# Patient Record
Sex: Male | Born: 1935
Health system: Southern US, Community
[De-identification: ages and names within clinical notes are randomized; demographics above are authoritative.]

## PROBLEM LIST (undated history)

## (undated) DIAGNOSIS — N400 Enlarged prostate without lower urinary tract symptoms: Secondary | ICD-10-CM

## (undated) DIAGNOSIS — Z860100 Personal history of colon polyps, unspecified: Secondary | ICD-10-CM

## (undated) DIAGNOSIS — I10 Essential (primary) hypertension: Secondary | ICD-10-CM

## (undated) DIAGNOSIS — C44320 Squamous cell carcinoma of skin of unspecified parts of face: Secondary | ICD-10-CM

## (undated) DIAGNOSIS — E785 Hyperlipidemia, unspecified: Secondary | ICD-10-CM

## (undated) DIAGNOSIS — H3530 Unspecified macular degeneration: Secondary | ICD-10-CM

## (undated) DIAGNOSIS — N200 Calculus of kidney: Secondary | ICD-10-CM

## (undated) DIAGNOSIS — I729 Aneurysm of unspecified site: Secondary | ICD-10-CM

## (undated) DIAGNOSIS — C439 Malignant melanoma of skin, unspecified: Secondary | ICD-10-CM

## (undated) HISTORY — PX: APPENDECTOMY: SHX54

## (undated) HISTORY — DX: Personal history of colon polyps, unspecified: Z86.0100

## (undated) HISTORY — DX: Unspecified macular degeneration: H35.30

## (undated) HISTORY — DX: Hyperlipidemia, unspecified: E78.5

## (undated) HISTORY — PX: LITHOTRIPSY: SUR834

## (undated) HISTORY — DX: Benign prostatic hyperplasia without lower urinary tract symptoms: N40.0

## (undated) HISTORY — DX: Essential (primary) hypertension: I10

## (undated) HISTORY — PX: OTHER SURGICAL HISTORY: SHX169

## (undated) HISTORY — DX: Personal history of colonic polyps: Z86.010

## (undated) HISTORY — DX: Aneurysm of unspecified site: I72.9

## (undated) HISTORY — DX: Malignant melanoma of skin, unspecified: C43.9

## (undated) HISTORY — DX: Squamous cell carcinoma of skin of unspecified parts of face: C44.320

## (undated) HISTORY — DX: Calculus of kidney: N20.0

---

## 1997-07-10 ENCOUNTER — Other Ambulatory Visit: Admission: RE | Admit: 1997-07-10 | Discharge: 1997-07-10 | Payer: Self-pay | Admitting: Urology

## 2001-11-07 HISTORY — PX: CHOLECYSTECTOMY: SHX55

## 2001-11-14 ENCOUNTER — Encounter: Payer: Self-pay | Admitting: General Surgery

## 2001-11-17 ENCOUNTER — Encounter: Payer: Self-pay | Admitting: General Surgery

## 2001-11-17 ENCOUNTER — Encounter (INDEPENDENT_AMBULATORY_CARE_PROVIDER_SITE_OTHER): Payer: Self-pay | Admitting: *Deleted

## 2001-11-17 ENCOUNTER — Observation Stay (HOSPITAL_COMMUNITY): Admission: RE | Admit: 2001-11-17 | Discharge: 2001-11-18 | Payer: Self-pay | Admitting: General Surgery

## 2003-01-01 ENCOUNTER — Ambulatory Visit (HOSPITAL_COMMUNITY): Admission: RE | Admit: 2003-01-01 | Discharge: 2003-01-01 | Payer: Self-pay | Admitting: Gastroenterology

## 2003-01-01 ENCOUNTER — Encounter (INDEPENDENT_AMBULATORY_CARE_PROVIDER_SITE_OTHER): Payer: Self-pay

## 2004-01-14 ENCOUNTER — Ambulatory Visit: Payer: Self-pay | Admitting: Family Medicine

## 2005-01-22 ENCOUNTER — Ambulatory Visit: Payer: Self-pay | Admitting: Family Medicine

## 2005-02-05 ENCOUNTER — Ambulatory Visit: Payer: Self-pay | Admitting: Family Medicine

## 2005-03-30 ENCOUNTER — Ambulatory Visit: Payer: Self-pay | Admitting: Family Medicine

## 2005-04-17 ENCOUNTER — Ambulatory Visit: Payer: Self-pay | Admitting: Family Medicine

## 2006-03-18 ENCOUNTER — Ambulatory Visit (HOSPITAL_COMMUNITY): Admission: RE | Admit: 2006-03-18 | Discharge: 2006-03-18 | Payer: Self-pay | Admitting: Urology

## 2006-03-19 ENCOUNTER — Ambulatory Visit: Payer: Self-pay | Admitting: Family Medicine

## 2006-03-19 LAB — CONVERTED CEMR LAB
ALT: 32 units/L (ref 0–40)
Alkaline Phosphatase: 81 units/L (ref 39–117)
BUN: 16 mg/dL (ref 6–23)
Basophils Relative: 0.9 % (ref 0.0–1.0)
CO2: 33 meq/L — ABNORMAL HIGH (ref 19–32)
Calcium: 9.4 mg/dL (ref 8.4–10.5)
Chol/HDL Ratio, serum: 3.2
Cholesterol: 127 mg/dL (ref 0–200)
Hemoglobin: 15.3 g/dL (ref 13.0–17.0)
Lymphocytes Relative: 21.4 % (ref 12.0–46.0)
Neutrophils Relative %: 64.9 % (ref 43.0–77.0)
PSA: 0.41 ng/mL (ref 0.10–4.00)
Potassium: 4.4 meq/L (ref 3.5–5.1)
TSH: 0.76 microintl units/mL (ref 0.35–5.50)
Total Protein: 7.2 g/dL (ref 6.0–8.3)
WBC: 9 10*3/uL (ref 4.5–10.5)

## 2006-03-25 ENCOUNTER — Ambulatory Visit: Payer: Self-pay | Admitting: Family Medicine

## 2006-11-19 DIAGNOSIS — H9190 Unspecified hearing loss, unspecified ear: Secondary | ICD-10-CM | POA: Insufficient documentation

## 2006-11-19 DIAGNOSIS — I10 Essential (primary) hypertension: Secondary | ICD-10-CM

## 2006-11-19 DIAGNOSIS — E785 Hyperlipidemia, unspecified: Secondary | ICD-10-CM

## 2007-04-13 ENCOUNTER — Ambulatory Visit: Payer: Self-pay | Admitting: Family Medicine

## 2007-04-13 DIAGNOSIS — N529 Male erectile dysfunction, unspecified: Secondary | ICD-10-CM | POA: Insufficient documentation

## 2008-04-17 ENCOUNTER — Ambulatory Visit: Payer: Self-pay | Admitting: Family Medicine

## 2008-04-17 DIAGNOSIS — Z860100 Personal history of colon polyps, unspecified: Secondary | ICD-10-CM | POA: Insufficient documentation

## 2008-05-01 ENCOUNTER — Ambulatory Visit: Payer: Self-pay | Admitting: Family Medicine

## 2009-04-19 ENCOUNTER — Ambulatory Visit: Payer: Self-pay | Admitting: Family Medicine

## 2009-04-19 DIAGNOSIS — L290 Pruritus ani: Secondary | ICD-10-CM | POA: Insufficient documentation

## 2009-04-19 DIAGNOSIS — R05 Cough: Secondary | ICD-10-CM

## 2009-04-29 ENCOUNTER — Telehealth: Payer: Self-pay | Admitting: Family Medicine

## 2009-05-07 ENCOUNTER — Ambulatory Visit: Payer: Self-pay | Admitting: Family Medicine

## 2009-05-09 ENCOUNTER — Telehealth: Payer: Self-pay | Admitting: Family Medicine

## 2009-05-09 DIAGNOSIS — C801 Malignant (primary) neoplasm, unspecified: Secondary | ICD-10-CM

## 2009-05-09 DIAGNOSIS — L570 Actinic keratosis: Secondary | ICD-10-CM

## 2009-09-16 ENCOUNTER — Ambulatory Visit: Payer: Self-pay | Admitting: Family Medicine

## 2009-09-16 DIAGNOSIS — R55 Syncope and collapse: Secondary | ICD-10-CM | POA: Insufficient documentation

## 2009-09-23 ENCOUNTER — Ambulatory Visit: Payer: Self-pay | Admitting: Family Medicine

## 2009-10-10 ENCOUNTER — Ambulatory Visit: Payer: Self-pay | Admitting: Family Medicine

## 2010-04-06 LAB — CONVERTED CEMR LAB
ALT: 26 units/L (ref 0–53)
ALT: 27 units/L (ref 0–53)
Albumin: 3.8 g/dL (ref 3.5–5.2)
Alkaline Phosphatase: 82 units/L (ref 39–117)
Basophils Relative: 0.2 % (ref 0.0–3.0)
Bilirubin Urine: NEGATIVE
Bilirubin, Direct: 0.1 mg/dL (ref 0.0–0.3)
Bilirubin, Direct: 0.2 mg/dL (ref 0.0–0.3)
Blood in Urine, dipstick: NEGATIVE
CO2: 31 meq/L (ref 19–32)
Calcium: 9.2 mg/dL (ref 8.4–10.5)
Calcium: 9.4 mg/dL (ref 8.4–10.5)
Chloride: 105 meq/L (ref 96–112)
Eosinophils Absolute: 0.4 10*3/uL (ref 0.0–0.7)
Eosinophils Absolute: 0.5 10*3/uL (ref 0.0–0.6)
Eosinophils Relative: 5.8 % — ABNORMAL HIGH (ref 0.0–5.0)
Eosinophils Relative: 6.8 % — ABNORMAL HIGH (ref 0.0–5.0)
GFR calc Af Amer: 95 mL/min
GFR calc non Af Amer: 79 mL/min
Glucose, Bld: 99 mg/dL (ref 70–99)
Glucose, Urine, Semiquant: NEGATIVE
Glucose, Urine, Semiquant: NEGATIVE
HDL: 38.3 mg/dL — ABNORMAL LOW (ref >39.0)
Hemoglobin: 14.2 g/dL (ref 13.0–17.0)
Hemoglobin: 15.1 g/dL (ref 13.0–17.0)
Ketones, urine, test strip: NEGATIVE
LDL Cholesterol: 106 mg/dL — ABNORMAL HIGH (ref 0–99)
Lymphocytes Relative: 18.8 % (ref 12.0–46.0)
Lymphocytes Relative: 21.7 % (ref 12.0–46.0)
MCHC: 33.1 g/dL (ref 30.0–36.0)
MCV: 82.8 fL (ref 78.0–100.0)
MCV: 83.8 fL (ref 78.0–100.0)
Monocytes Absolute: 0.5 10*3/uL (ref 0.2–0.7)
Neutro Abs: 4.5 10*3/uL (ref 1.4–7.7)
Neutro Abs: 4.9 10*3/uL (ref 1.4–7.7)
Neutrophils Relative %: 65.8 % (ref 43.0–77.0)
PSA: 0.4 ng/mL (ref 0.10–4.00)
PSA: 0.45 ng/mL (ref 0.10–4.00)
Platelets: 199 10*3/uL (ref 150–400)
Potassium: 4.7 meq/L (ref 3.5–5.1)
RBC: 5.42 M/uL (ref 4.22–5.81)
Sodium: 141 meq/L (ref 135–145)
Sodium: 144 meq/L (ref 135–145)
TSH: 1.31 microintl units/mL (ref 0.35–5.50)
Total CHOL/HDL Ratio: 4
Total Protein: 7.6 g/dL (ref 6.0–8.3)
Triglycerides: 105 mg/dL (ref 0–149)
WBC Urine, dipstick: NEGATIVE
WBC Urine, dipstick: NEGATIVE
WBC: 7 10*3/uL (ref 4.5–10.5)
WBC: 7.4 10*3/uL (ref 4.5–10.5)
pH: 6.5

## 2010-04-10 NOTE — Assessment & Plan Note (Signed)
Summary: lesion removal on face/njr   Vitals Entered By: Gladis Riffle, RN (May 07, 2009 4:10 PM) CC: lesion removal left cheek and bridge of nose Is Patient Diabetic? No   CC:  lesion removal left cheek and bridge of nose.  History of Present Illness: Derek Baker is a 75 year old male, who comes in today for removal of 3 facial lesions.  Number one 8-mm x  8-mm was between his eyebrows.  Number two... 8 mm times 8 mm in front of left ear.  Number 3.........Marland Kitchen 8 mm x  8-mm.  This left eye    all 3 lesions were anesthetized with 1% xylo  with epi......... shave excisions were done.  The lesions were sent for pathologic analysis.  X 3.  The areas were cauterized.  Band-Aids were applied.  Instructions given for wound care follow-up  as needed   Preventive Screening-Counseling & Management  Alcohol-Tobacco     Smoking Status: quit  Medications Prior to Update: 1)  Simvastatin 20 Mg  Tabs (Simvastatin) .... Take 1 Tablet By Mouth At Bedtime 2)  Hydrochlorothiazide 25 Mg  Tabs (Hydrochlorothiazide) .... Take 1 Tablet By Mouth Once A Day 3)  Amlodipine Besylate 10 Mg  Tabs (Amlodipine Besylate) .... Take 1 Tablet By Mouth Once A Day 4)  Aspirin 81 Mg  Tbec (Aspirin) .... Once Daily 5)  Lisinopril 40 Mg  Tabs (Lisinopril) .... Once Daily 6)  Multivitamins   Tabs (Multiple Vitamin) .... Once Daily 7)  Fish Oil   Oil (Fish Oil) .... Once Daily 8)  Cialis 20 Mg  Tabs (Tadalafil) .... Uad 9)  Anusol-Hc 2.5 % Crea (Hydrocortisone) .... Apply At Bedtime  Allergies (verified): No Known Drug Allergies   Complete Medication List: 1)  Simvastatin 20 Mg Tabs (Simvastatin) .... Take 1 tablet by mouth at bedtime 2)  Hydrochlorothiazide 25 Mg Tabs (Hydrochlorothiazide) .... Take 1 tablet by mouth once a day 3)  Amlodipine Besylate 10 Mg Tabs (Amlodipine besylate) .... Take 1 tablet by mouth once a day 4)  Aspirin 81 Mg Tbec (Aspirin) .... Once daily 5)  Lisinopril 40 Mg Tabs (Lisinopril) .... Once  daily 6)  Multivitamins Tabs (Multiple vitamin) .... Once daily 7)  Fish Oil Oil (Fish oil) .... Once daily 8)  Cialis 20 Mg Tabs (Tadalafil) .... Uad 9)  Anusol-hc 2.5 % Crea (Hydrocortisone) .... Apply at bedtime  Other Orders: Excise Malig lesion ([facility]) 0.6 - 1.0 cm (04540) Excise lesion ([facility]) 0.6-1.0 cm  (11421)

## 2010-04-10 NOTE — Progress Notes (Signed)
  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  He understands and will call [name] to more Initial call taken by: Roderick Pee MD,  May 09, 2009 5:50 PM     Appended Document: Orders Update    Clinical Lists Changes  Orders: Added new Referral order of Dermatology Referral ([facility]) - Signed

## 2010-04-10 NOTE — Assessment & Plan Note (Signed)
Summary: EVAL / NEAR SYNCOPAL EPISODE // RS   Vital Signs:  Patient profile:   75 year old male Weight:      237 pounds Temp:     98.0 degrees F oral BP sitting:   170 / 80  (left arm) Cuff size:   regular  Vitals Entered By: Kern Reap CMA Duncan Dull) (September 16, 2009 3:02 PM) CC: syncopal episode   CC:  syncopal episode.  History of Present Illness: Derek Baker is a 75 year old male, married, nonsmoker, who comes in today accompanied by his wife for evaluation of a near syncopal episode.  Last Saturday night.  He and his wife had just finished dinner.  He was cleaning the table, and all of a sudden, he felt lightheaded.  He felt clammy felt like he was on a pass out, but didn't.  He said that it rested for a few minutes and felt fine.  He had no nausea, vomiting, chest pain, shortness of breath, etc..  BP today here 170/80.  He says his blood pressures at home average 127/62.  His never had a near syncopal or syncopal episode like this in the past.  Allergies: No Known Drug Allergies  Past History:  Past medical, surgical, family and social histories (including risk factors) reviewed, and no changes noted (except as noted below).  Past Medical History: Reviewed history from 04/17/2008 and no changes required. Hyperlipidemia Hypertension hearing loss R ear ED Colonic polyps, hx of  Past Surgical History: Reviewed history from 11/19/2006 and no changes required. Cholecystectomy  09/03  Family History: Reviewed history from 04/13/2007 and no changes required. father died at 9, with a heart attack, question colon.  Question prostate cancer mother is in her 60s has hyperlipidemia 3 sisters in good health  Social History: Reviewed history from 04/13/2007 and no changes required. Retired Married Former Smoker Alcohol use-yes Drug use-no Regular exercise-yes  Review of Systems      See HPI  Physical Exam  General:  Well-developed,well-nourished,in no acute distress;  alert,appropriate and cooperative throughout examination Head:  Normocephalic and atraumatic without obvious abnormalities. No apparent alopecia or balding. Eyes:  No corneal or conjunctival inflammation noted. EOMI. Perrla. Funduscopic exam benign, without hemorrhages, exudates or papilledema. Vision grossly normal. Ears:  External ear exam shows no significant lesions or deformities.  Otoscopic examination reveals clear canals, tympanic membranes are intact bilaterally without bulging, retraction, inflammation or discharge. Hearing is grossly normal bilaterally. Nose:  External nasal examination shows no deformity or inflammation. Nasal mucosa are pink and moist without lesions or exudates. Mouth:  Oral mucosa and oropharynx without lesions or exudates.  Teeth in good repair. Neck:  No deformities, masses, or tenderness noted. Chest Wall:  No deformities, masses, tenderness or gynecomastia noted. Lungs:  Normal respiratory effort, chest expands symmetrically. Lungs are clear to auscultation, no crackles or wheezes. Heart:  Normal rate and regular rhythm. S1 and S2 normal without gallop, murmur, click, rub or other extra sounds. Pulses:  R and L carotid,radial,femoral,dorsalis pedis and posterior tibial pulses are full and equal bilaterallyno carotid bruits   Impression & Recommendations:  Problem # 1:  SYNCOPE AND COLLAPSE (ICD-780.2) Assessment New  Orders: EKG w/ Interpretation (93000)  Complete Medication List: 1)  Simvastatin 20 Mg Tabs (Simvastatin) .... Take 1 tablet by mouth at bedtime 2)  Hydrochlorothiazide 25 Mg Tabs (Hydrochlorothiazide) .... Take 1 tablet by mouth once a day 3)  Amlodipine Besylate 10 Mg Tabs (Amlodipine besylate) .... Take 1 tablet by mouth once a day  4)  Aspirin 81 Mg Tbec (Aspirin) .... Once daily 5)  Lisinopril 40 Mg Tabs (Lisinopril) .... Once daily 6)  Multivitamins Tabs (Multiple vitamin) .... Once daily 7)  Fish Oil Oil (Fish oil) .... Once daily 8)   Cialis 20 Mg Tabs (Tadalafil) .... Uad 9)  Anusol-hc 2.5 % Crea (Hydrocortisone) .... Apply at bedtime  Patient Instructions: 1)  check your blood pressure 3 times a day with a new digital blood pressure cuff and bring a record of all your blood pressure readings and the device back in one week for follow-up. 2)  In the meantime if you have any episodes of lightheadedness immediately lie on the floor and put your feet up in the air

## 2010-04-10 NOTE — Assessment & Plan Note (Signed)
Summary: 1 wk rov/njr   Vital Signs:  Patient profile:   75 year old male Weight:      236 pounds Temp:     97.6 degrees F oral BP sitting:   170 / 90  (right arm) Cuff size:   regular  Vitals Entered By: Kern Reap CMA Duncan Dull) (September 23, 2009 1:48 PM) CC: follow-up visit   CC:  follow-up visit.  History of Present Illness: Derek Baker is a 75 year old male, who comes in today for follow-up of hypertension.  His current medications are hydrochlorothiazide 25 mg daily, Norvasc, 10 mg daily, lisinopril 80 mg daily.  BP 150/70.  He also wonders if his Cialis which he takes 10 mg p.r.n. might be contributing to episodes of low blood pressure.  The digital device.  They bring in his not accurate  Allergies: No Known Drug Allergies  Review of Systems      See HPI  Physical Exam  General:  Well-developed,well-nourished,in no acute distress; alert,appropriate and cooperative throughout examination Heart:  150/70 right arm sitting position   Impression & Recommendations:  Problem # 1:  HYPERTENSION (ICD-401.9) Assessment Improved  His updated medication list for this problem includes:    Hydrochlorothiazide 25 Mg Tabs (Hydrochlorothiazide) .Marland Kitchen... Take 1 tablet by mouth once a day    Amlodipine Besylate 10 Mg Tabs (Amlodipine besylate) .Marland Kitchen... Take 1 tablet by mouth once a day    Lisinopril 40 Mg Tabs (Lisinopril) ..... Once daily  Complete Medication List: 1)  Simvastatin 20 Mg Tabs (Simvastatin) .... Take 1 tablet by mouth at bedtime 2)  Hydrochlorothiazide 25 Mg Tabs (Hydrochlorothiazide) .... Take 1 tablet by mouth once a day 3)  Amlodipine Besylate 10 Mg Tabs (Amlodipine besylate) .... Take 1 tablet by mouth once a day 4)  Aspirin 81 Mg Tbec (Aspirin) .... Once daily 5)  Lisinopril 40 Mg Tabs (Lisinopril) .... Once daily 6)  Multivitamins Tabs (Multiple vitamin) .... Once daily 7)  Fish Oil Oil (Fish oil) .... Once daily 8)  Cialis 20 Mg Tabs (Tadalafil) .... Uad 9)   Anusol-hc 2.5 % Crea (Hydrocortisone) .... Apply at bedtime  Patient Instructions: 1)  continue current medications. 2)  Quarter the Cialis and monitor your blood pressure.  The next day or two after using the Cialis and if it appears this Cialis is causing her blood pressure dropped and hold lisinopril for a day or two 3)  return in 3 weeks for follow-up with your blood pressure readings i......and  the new device

## 2010-04-10 NOTE — Progress Notes (Signed)
Summary: NEW RX  Phone Note Call from Patient Call back at Home Phone [phone]   Caller: Patient Call For: Roderick Pee MD Summary of Call: PT NEEDS NEW RX CIALIS 20 MG CALL INTO [facility] [phone]. Initial call taken by: Heron Sabins,  April 29, 2009 12:37 PM    Prescriptions: CIALIS 20 MG  TABS (TADALAFIL) UAD  #6 x 11   Entered by:   Kern Reap CMA (AAMA)   Authorized by:   Roderick Pee MD   Signed by:   Kern Reap CMA (AAMA) on 04/29/2009   Method used:   Electronically to        [facility]* (retail)       [address]       [place], [place]  [address]       Ph: [phone]       Fax: [phone]   RxID:   [id number]

## 2010-04-10 NOTE — Assessment & Plan Note (Signed)
Summary: 3 wk rov/njr   Vital Signs:  Patient profile:   75 year old male Weight:      234 pounds Temp:     97.6 degrees F oral BP sitting:   140 / 80  (left arm) Cuff size:   regular  Vitals Entered By: Kern Reap CMA Duncan Dull) (October 10, 2009 8:46 AM) CC: follow-up visit   CC:  follow-up visit.  History of Present Illness: Derek Baker is a 75 year old, married male, who comes in today for evaluation of hypertension.  His current antihypertensive treatment for her thiazide 25 mg daily, Norvasc, 10 mg daily, lisinopril 40 mg daily.  BP 140/80 with his cuff and ours........  BPs at home, normal  Allergies: No Known Drug Allergies  Past History:  Past medical, surgical, family and social histories (including risk factors) reviewed for relevance to current acute and chronic problems.  Past Medical History: Reviewed history from 04/17/2008 and no changes required. Hyperlipidemia Hypertension hearing loss R ear ED Colonic polyps, hx of  Past Surgical History: Reviewed history from 11/19/2006 and no changes required. Cholecystectomy  09/03  Family History: Reviewed history from 04/13/2007 and no changes required. father died at 70, with a heart attack, question colon.  Question prostate cancer mother is in her 78s has hyperlipidemia 3 sisters in good health  Social History: Reviewed history from 04/13/2007 and no changes required. Retired Married Former Smoker Alcohol use-yes Drug use-no Regular exercise-yes  Review of Systems      See HPI  Physical Exam  General:  Well-developed,well-nourished,in no acute distress; alert,appropriate and cooperative throughout examination Heart:   140/80   Complete Medication List: 1)  Simvastatin 20 Mg Tabs (Simvastatin) .... Take 1 tablet by mouth at bedtime 2)  Hydrochlorothiazide 25 Mg Tabs (Hydrochlorothiazide) .... Take 1 tablet by mouth once a day 3)  Amlodipine Besylate 10 Mg Tabs (Amlodipine besylate) .... Take 1  tablet by mouth once a day 4)  Aspirin 81 Mg Tbec (Aspirin) .... Once daily 5)  Lisinopril 40 Mg Tabs (Lisinopril) .... Once daily 6)  Multivitamins Tabs (Multiple vitamin) .... Once daily 7)  Fish Oil Oil (Fish oil) .... Once daily 8)  Cialis 20 Mg Tabs (Tadalafil) .... Uad 9)  Anusol-hc 2.5 % Crea (Hydrocortisone) .... Apply at bedtime  Patient Instructions: 1)  continue current medications. 2)  Check a blood pressure weekly. 3)  Return in February for your manual exam, sooner if any problems

## 2010-04-10 NOTE — Assessment & Plan Note (Signed)
Summary: emp/pt fasting/cjr   Vital Signs:  Patient profile:   75 year old male Height:      71 inches Weight:      234 pounds BMI:     32.75 Temp:     99.1 degrees F oral BP sitting:   142 / 90  (left arm) Cuff size:   regular  Vitals Entered By: Kern Reap CMA Duncan Dull) (April 19, 2009 8:31 AM)  Reason for Visit cpx  History of Present Illness: Derek Baker is a 75 year old male, retired, nonsmoker, who comes in today for multiple issues.  He takes simvastatin 20 mg nightly for hyperlipidemia.  Will check lipid panel today.  He takes hydrochlorothiazide 25 mg daily, amlodipine 10 mg daily, lisinopril 40 mg daily BP at home systolic ranges from 120 to 130, diastolic 70 to 80.  He also uses Cialis p.r.n. erectile dysfunction.  He takes an aspirin tablet daily.  His having trouble with rectal itching.  Colonoscopy last year.  Normal  He also has some skin lesions he would like checked.  He had a febrile illness in December with a mild cough.  The cough has persisted.  He, thinks he's wheezing.  He gets routine eye care.  Dental care.  Colonoscopy done in GI 2009 normal, tetanus, 2009, seasonal flu 2010, Pneumovax 2009, shingles 2010.  Allergies: No Known Drug Allergies  Past History:  Past medical, surgical, family and social histories (including risk factors) reviewed, and no changes noted (except as noted below).  Past Medical History: Reviewed history from 04/17/2008 and no changes required. Hyperlipidemia Hypertension hearing loss R ear ED Colonic polyps, hx of  Past Surgical History: Reviewed history from 11/19/2006 and no changes required. Cholecystectomy  09/03  Family History: Reviewed history from 04/13/2007 and no changes required. father died at 49, with a heart attack, question colon.  Question prostate cancer mother is in her 53s has hyperlipidemia 3 sisters in good health  Social History: Reviewed history from 04/13/2007 and no changes  required. Retired Married Former Smoker Alcohol use-yes Drug use-no Regular exercise-yes  Review of Systems      See HPI  Physical Exam  General:  Well-developed,well-nourished,in no acute distress; alert,appropriate and cooperative throughout examination Head:  Normocephalic and atraumatic without obvious abnormalities. No apparent alopecia or balding. Eyes:  No corneal or conjunctival inflammation noted. EOMI. Perrla. Funduscopic exam benign, without hemorrhages, exudates or papilledema. Vision grossly normal. Ears:  External ear exam shows no significant lesions or deformities.  Otoscopic examination reveals clear canals, tympanic membranes are intact bilaterally without bulging, retraction, inflammation or discharge. Hearing is grossly normal bilaterally. Nose:  External nasal examination shows no deformity or inflammation. Nasal mucosa are pink and moist without lesions or exudates. Mouth:  Oral mucosa and oropharynx without lesions or exudates.  Teeth in good repair. Neck:  No deformities, masses, or tenderness noted. Chest Wall:  No deformities, masses, tenderness or gynecomastia noted. Breasts:  No masses or gynecomastia noted Lungs:  Normal respiratory effort, chest expands symmetrically. Lungs are clear to auscultation, no crackles or wheezes. Heart:  Normal rate and regular rhythm. S1 and S2 normal without gallop, murmur, click, rub or other extra sounds. Abdomen:  Bowel sounds positive,abdomen soft and non-tender without masses, organomegaly or hernias noted. Rectal:  No external abnormalities noted. Normal sphincter tone. No rectal masses or tenderness. Genitalia:  Testes bilaterally descended without nodularity, tenderness or masses. No scrotal masses or lesions. No penis lesions or urethral discharge. Prostate:  Prostate gland firm and smooth,  no enlargement, nodularity, tenderness, mass, asymmetry or induration. Msk:  No deformity or scoliosis noted of thoracic or lumbar  spine.   Pulses:  R and L carotid,radial,femoral,dorsalis pedis and posterior tibial pulses are full and equal bilaterally Extremities:  No clubbing, cyanosis, edema, or deformity noted with normal full range of motion of all joints.   Neurologic:  No cranial nerve deficits noted. Station and gait are normal. Plantar reflexes are down-going bilaterally. DTRs are symmetrical throughout. Sensory, motor and coordinative functions appear intact. Skin:  Intact without suspicious lesions or rashes Cervical Nodes:  No lymphadenopathy noted Axillary Nodes:  No palpable lymphadenopathy Inguinal Nodes:  No significant adenopathy Psych:  Cognition and judgment appear intact. Alert and cooperative with normal attention span and concentration. No apparent delusions, illusions, hallucinations   Impression & Recommendations:  Problem # 1:  ERECTILE DYSFUNCTION, ORGANIC (ICD-607.84) Assessment Improved  His updated medication list for this problem includes:    Cialis 20 Mg Tabs (Tadalafil) ..... Uad  Orders: Venipuncture (16109) TLB-Lipid Panel (80061-LIPID) TLB-BMP (Basic Metabolic Panel-BMET) (80048-METABOL) TLB-CBC Platelet - w/Differential (85025-CBCD) TLB-Hepatic/Liver Function Pnl (80076-HEPATIC) TLB-TSH (Thyroid Stimulating Hormone) (84443-TSH) TLB-PSA (Prostate Specific Antigen) (84153-PSA) Prescription Created Electronically [phone]) UA Dipstick w/o Micro (automated)  (81003)  Problem # 2:  HYPERTENSION (ICD-401.9) Assessment: Improved  His updated medication list for this problem includes:    Hydrochlorothiazide 25 Mg Tabs (Hydrochlorothiazide) .Marland Kitchen... Take 1 tablet by mouth once a day    Amlodipine Besylate 10 Mg Tabs (Amlodipine besylate) .Marland Kitchen... Take 1 tablet by mouth once a day    Lisinopril 40 Mg Tabs (Lisinopril) ..... Once daily  Orders: Venipuncture (09811) TLB-Lipid Panel (80061-LIPID) TLB-BMP (Basic Metabolic Panel-BMET) (80048-METABOL) TLB-CBC Platelet - w/Differential  (85025-CBCD) TLB-Hepatic/Liver Function Pnl (80076-HEPATIC) TLB-TSH (Thyroid Stimulating Hormone) (84443-TSH) TLB-PSA (Prostate Specific Antigen) (84153-PSA) Prescription Created Electronically [phone]) UA Dipstick w/o Micro (automated)  (81003) EKG w/ Interpretation (93000)  Problem # 3:  HYPERLIPIDEMIA (ICD-272.4) Assessment: Improved  His updated medication list for this problem includes:    Simvastatin 20 Mg Tabs (Simvastatin) .Marland Kitchen... Take 1 tablet by mouth at bedtime  Orders: Venipuncture (29562) TLB-Lipid Panel (80061-LIPID) TLB-BMP (Basic Metabolic Panel-BMET) (80048-METABOL) TLB-CBC Platelet - w/Differential (85025-CBCD) TLB-Hepatic/Liver Function Pnl (80076-HEPATIC) TLB-TSH (Thyroid Stimulating Hormone) (84443-TSH) TLB-PSA (Prostate Specific Antigen) (84153-PSA) Prescription Created Electronically [phone]) UA Dipstick w/o Micro (automated)  (81003) EKG w/ Interpretation (93000)  Problem # 4:  COUGH (ICD-786.2) Assessment: New  Orders: Venipuncture (57846) TLB-Lipid Panel (80061-LIPID) TLB-BMP (Basic Metabolic Panel-BMET) (80048-METABOL) TLB-CBC Platelet - w/Differential (85025-CBCD) TLB-Hepatic/Liver Function Pnl (80076-HEPATIC) TLB-TSH (Thyroid Stimulating Hormone) (84443-TSH) TLB-PSA (Prostate Specific Antigen) (84153-PSA) Prescription Created Electronically [phone]) UA Dipstick w/o Micro (automated)  (81003) T-2 View CXR (71020TC)  Problem # 5:  ANAL PRURITUS (ICD-698.0) Assessment: New  Orders: Venipuncture (28413) TLB-Lipid Panel (80061-LIPID) TLB-BMP (Basic Metabolic Panel-BMET) (80048-METABOL) TLB-CBC Platelet - w/Differential (85025-CBCD) TLB-Hepatic/Liver Function Pnl (80076-HEPATIC) TLB-TSH (Thyroid Stimulating Hormone) (84443-TSH) TLB-PSA (Prostate Specific Antigen) (84153-PSA) Prescription Created Electronically [phone]) UA Dipstick w/o Micro (automated)  (81003)  Complete Medication List: 1)  Simvastatin 20 Mg Tabs (Simvastatin) .... Take 1  tablet by mouth at bedtime 2)  Hydrochlorothiazide 25 Mg Tabs (Hydrochlorothiazide) .... Take 1 tablet by mouth once a day 3)  Amlodipine Besylate 10 Mg Tabs (Amlodipine besylate) .... Take 1 tablet by mouth once a day 4)  Aspirin 81 Mg Tbec (Aspirin) .... Once daily 5)  Lisinopril 40 Mg Tabs (Lisinopril) .... Once daily 6)  Multivitamins Tabs (Multiple vitamin) .... Once daily 7)  Fish Oil Oil (Fish oil) .Marland KitchenMarland KitchenMarland Kitchen  Once daily 8)  Cialis 20 Mg Tabs (Tadalafil) .... Uad 9)  Anusol-hc 2.5 % Crea (Hydrocortisone) .... Apply at bedtime  Patient Instructions: 1)  return sometime in the next couple weeks to have the two lesions removed from her face. 2)  Stop by the main office on the way home for a chest x-ray....... I will call you the report 3)  use the Anusol-HC cream at bedtime p.r.n. 4)  Please schedule a follow-up appointment in 1 year. Prescriptions: CIALIS 20 MG  TABS (TADALAFIL) UAD  #6 x 11   Entered and Authorized by:   Roderick Pee MD   Signed by:   Roderick Pee MD on 04/19/2009   Method used:   Electronically to        [facility]* (mail-order)             ,          Ph: [phone]       Fax: [phone]   RxID:   [id number] LISINOPRIL 40 MG  TABS (LISINOPRIL) once daily  #100 x 4   Entered and Authorized by:   Roderick Pee MD   Signed by:   Roderick Pee MD on 04/19/2009   Method used:   Electronically to        [facility]* (mail-order)             ,          Ph: [phone]       Fax: [phone]   RxID:   [id number] AMLODIPINE BESYLATE 10 MG  TABS (AMLODIPINE BESYLATE) Take 1 tablet by mouth once a day  #100 x 4   Entered and Authorized by:   Roderick Pee MD   Signed by:   Roderick Pee MD on 04/19/2009   Method used:   Electronically to        [facility]* (mail-order)             ,          Ph: [phone]       Fax: [phone]   RxID:   [id number] HYDROCHLOROTHIAZIDE 25 MG  TABS (HYDROCHLOROTHIAZIDE) Take 1 tablet by mouth  once a day  #100 x 4   Entered and Authorized by:   Roderick Pee MD   Signed by:   Roderick Pee MD on 04/19/2009   Method used:   Electronically to        [facility]* (mail-order)             ,          Ph: [phone]       Fax: [phone]   RxID:   [id number] SIMVASTATIN 20 MG  TABS (SIMVASTATIN) Take 1 tablet by mouth at bedtime  #100 x 4   Entered and Authorized by:   Roderick Pee MD   Signed by:   Roderick Pee MD on 04/19/2009   Method used:   Electronically to        [facility]* (mail-order)             ,          Ph: [phone]       Fax: [phone]   RxID:   [id number] ANUSOL-HC 2.5 % CREA (HYDROCORTISONE) apply at bedtime  #30 gr x 4   Entered and Authorized by:   Roderick Pee MD   Signed by:   Tinnie Gens  Shawnie Dapper MD on 04/19/2009   Method used:   Electronically to        [facility]* [facility])             ,          Ph: [phone]       Fax: [phone]   RxID:   [id number]    Immunization History:  Influenza Immunization History:    Influenza:  historical (12/07/2008)   Appended Document: emp/pt fasting/cjr  Laboratory Results   Urine Tests    Routine Urinalysis   Color: yellow Appearance: Clear Glucose: negative   (Normal Range: Negative) Bilirubin: negative   (Normal Range: Negative) Ketone: negative   (Normal Range: Negative) Spec. Gravity: 1.020   (Normal Range: 1.003-1.035) Blood: negative   (Normal Range: Negative) pH: 5.5   (Normal Range: 5.0-8.0) Protein: negative   (Normal Range: Negative) Urobilinogen: 0.2   (Normal Range: 0-1) Nitrite: negative   (Normal Range: Negative) Leukocyte Esterace: negative   (Normal Range: Negative)    Comments: Rita Ohara  April 19, 2009 5:03 PM

## 2010-04-18 ENCOUNTER — Encounter: Payer: Self-pay | Admitting: Family Medicine

## 2010-04-21 ENCOUNTER — Ambulatory Visit (INDEPENDENT_AMBULATORY_CARE_PROVIDER_SITE_OTHER): Payer: Medicare Other | Admitting: Family Medicine

## 2010-04-21 ENCOUNTER — Encounter: Payer: Self-pay | Admitting: Family Medicine

## 2010-04-21 DIAGNOSIS — E785 Hyperlipidemia, unspecified: Secondary | ICD-10-CM

## 2010-04-21 DIAGNOSIS — N138 Other obstructive and reflux uropathy: Secondary | ICD-10-CM

## 2010-04-21 DIAGNOSIS — H9190 Unspecified hearing loss, unspecified ear: Secondary | ICD-10-CM

## 2010-04-21 DIAGNOSIS — I10 Essential (primary) hypertension: Secondary | ICD-10-CM

## 2010-04-21 DIAGNOSIS — N139 Obstructive and reflux uropathy, unspecified: Secondary | ICD-10-CM

## 2010-04-21 DIAGNOSIS — N401 Enlarged prostate with lower urinary tract symptoms: Secondary | ICD-10-CM

## 2010-04-21 DIAGNOSIS — N529 Male erectile dysfunction, unspecified: Secondary | ICD-10-CM

## 2010-04-21 LAB — CBC WITH DIFFERENTIAL/PLATELET
Basophils Absolute: 0 10*3/uL (ref 0.0–0.1)
Basophils Relative: 0.4 % (ref 0.0–3.0)
Eosinophils Relative: 6.7 % — ABNORMAL HIGH (ref 0.0–5.0)
Hemoglobin: 14.9 g/dL (ref 13.0–17.0)
Lymphocytes Relative: 21 % (ref 12.0–46.0)
Monocytes Relative: 7.9 % (ref 3.0–12.0)
Neutro Abs: 4.9 10*3/uL (ref 1.4–7.7)
RBC: 5.24 Mil/uL (ref 4.22–5.81)
RDW: 14.6 % (ref 11.5–14.6)
WBC: 7.6 10*3/uL (ref 4.5–10.5)

## 2010-04-21 LAB — BASIC METABOLIC PANEL
BUN: 19 mg/dL (ref 6–23)
CO2: 29 mEq/L (ref 19–32)
Chloride: 100 mEq/L (ref 96–112)
Creatinine, Ser: 0.9 mg/dL (ref 0.4–1.5)
Glucose, Bld: 103 mg/dL — ABNORMAL HIGH (ref 70–99)

## 2010-04-21 LAB — POCT URINALYSIS DIPSTICK
Bilirubin, UA: NEGATIVE
Blood, UA: NEGATIVE
Ketones, UA: NEGATIVE
Protein, UA: NEGATIVE
pH, UA: 7

## 2010-04-21 LAB — LIPID PANEL
LDL Cholesterol: 73 mg/dL (ref 0–99)
Total CHOL/HDL Ratio: 3

## 2010-04-21 LAB — HEPATIC FUNCTION PANEL
AST: 21 U/L (ref 0–37)
Albumin: 3.7 g/dL (ref 3.5–5.2)
Alkaline Phosphatase: 73 U/L (ref 39–117)

## 2010-04-21 LAB — TSH: TSH: 0.86 u[IU]/mL (ref 0.35–5.50)

## 2010-04-21 MED ORDER — AMLODIPINE BESYLATE 10 MG PO TABS: 10.0000 mg | ORAL_TABLET | Freq: Every day | ORAL | Status: DC

## 2010-04-21 MED ORDER — TADALAFIL 20 MG PO TABS: 20.0000 mg | ORAL_TABLET | Freq: Every day | ORAL | Status: DC | PRN

## 2010-04-21 MED ORDER — HYDROCHLOROTHIAZIDE 25 MG PO TABS: 25.0000 mg | ORAL_TABLET | Freq: Every day | ORAL | Status: DC

## 2010-04-21 MED ORDER — SIMVASTATIN 20 MG PO TABS
20.0000 mg | ORAL_TABLET | Freq: Every day | ORAL | Status: DC
Start: 2010-04-21 — End: 2011-04-27

## 2010-04-21 MED ORDER — LISINOPRIL 40 MG PO TABS: 40.0000 mg | ORAL_TABLET | Freq: Every day | ORAL | Status: DC

## 2010-04-21 NOTE — Progress Notes (Signed)
  Subjective:    Patient ID: Derek Baker, male    DOB: 1935/06/06, 75 y.o.   MRN: [id number]  [name] is a delightful, 75 year old, married male, nonsmoker, who comes in today for annual Medicare exam because of underlying hypertension, hyperlipidemia, and erectile dysfunction.  His hypertension his shoe is Norvasc 10 mg daily, hydrochlorothiazide, 25 mg daily, lisinopril 40 mg daily.  BP 130/80.  His hyperlipidemia is treated with simvastatin 20 mg nightly will check labs today.  For erectile dysfunction.  He uses Cialis 20 mg p.r.n.  He gets routine eye care, dental care, is up-to-date on all his vaccinations and is colonoscopy.    Review of Systems  All other systems reviewed and are negative.       Objective:   Physical Exam  Constitutional: He is oriented to person, place, and time. He appears well-developed and well-nourished.  HENT:  Head: Normocephalic and atraumatic.  Right Ear: External ear normal.  Left Ear: External ear normal.  Nose: Nose normal.  Mouth/Throat: Oropharynx is clear and moist.       He has a hearing aid in his right ear and his right ear canal is full. Of Wax  Eyes: Conjunctivae and EOM are normal. Pupils are equal, round, and reactive to light.  Neck: Normal range of motion. Neck supple. No JVD present. No tracheal deviation present. No thyromegaly present.  Cardiovascular: Normal rate, regular rhythm, normal heart sounds and intact distal pulses.  Exam reveals no gallop and no friction rub.   No murmur heard. Pulmonary/Chest: Effort normal and breath sounds normal. No stridor. No respiratory distress. He has no wheezes. He has no rales. He exhibits no tenderness.  Abdominal: Soft. Bowel sounds are normal. He exhibits no distension and no mass. There is no tenderness. There is no rebound and no guarding.  Genitourinary: Rectum normal, prostate normal and penis normal. Guaiac negative stool. No penile tenderness.  Musculoskeletal: Normal range  of motion. He exhibits no edema and no tenderness.  Lymphadenopathy:    He has no cervical adenopathy.  Neurological: He is alert and oriented to person, place, and time. He has normal reflexes. He displays normal reflexes. No cranial nerve deficit. He exhibits normal muscle tone.  Skin: Skin is warm and dry. No rash noted. No erythema. No pallor.  Psychiatric: He has a normal mood and affect. His behavior is normal. Judgment and thought content normal.          Assessment & Plan:

## 2010-04-21 NOTE — Progress Notes (Signed)
Addended by: Rossie Muskrat on: 04/21/2010 09:29 AM   Modules accepted: Orders

## 2010-04-21 NOTE — Patient Instructions (Signed)
Continue diet and exercise program, and monitor your blood pressure.  If your blood pressure drops too low or u  began having episodes of lightheadedness when he stands up , cut the  the Norvasc in  Half  Return in one year for her annual exam

## 2010-07-25 NOTE — Op Note (Signed)
Derek Baker, CLINE NO.:  [id number]   MEDICAL RECORD NO.:  [id number]                   PATIENT TYPE:  AMB   LOCATION:  DAY                                  FACILITY:  [facility]   PHYSICIAN:  Angelia Mould. Derrell Lolling, M.D.             DATE OF BIRTH:  [birth year]   DATE OF PROCEDURE:  11/17/2001  DATE OF DISCHARGE:                                 OPERATIVE REPORT   PREOPERATIVE DIAGNOSIS:  Chronic cholecystitis with cholelithiasis.   POSTOPERATIVE DIAGNOSIS:  Chronic cholecystitis with cholelithiasis.   OPERATION PERFORMED:  Laparoscopic cholecystectomy with intraoperative  cholangiogram.   SURGEON:  Angelia Mould. Derrell Lolling, M.D.   FIRST ASSISTANT:  Timothy E. Earlene Plater, M.D.   INDICATIONS FOR PROCEDURE:  This is a 75 year old white man who has a four  month history of intermittent episodes of indigestion, reflux, nausea,  diaphoresis, bloating. He feels a tightness and throbbing discomfort in his  lower chest but no severe pain. Cardiac workup had been negative.  Gallbladder ultrasound showed that the gallbladder was filled with  gallstones. Liver function tests are normal. He was brought to the operating  room electively.   OPERATIVE FINDINGS:  The gallbladder was filled with tiny black stones and  black inspissated bile. There was extensive adhesions to the gallbladder  suggesting numerous prior inflammatory episodes. The liver appeared healthy.  The operative cholangiogram showed no abnormalities. The bile ducts were of  normal caliber, no filling defect, normal intrahepatic and extrahepatic  anatomy and prompt flow of contrast into the duodenum. There were a few  adhesions to the right lower quadrant. The small intestines, large  intestines, stomach and duodenum looked normal.   OPERATIVE TECHNIQUE:  Following the induction of general endotracheal  anesthesia, the patient's abdomen was prepped and draped in a sterile  fashion. The 0.5% Marcaine with  epinephrine was introduced as a local  infiltration anesthetic. A vertically oriented incision was made at the  superior rim of the umbilicus. The fascia was incised in the midline and the  abdominal cavity entered under direct vision. A 10 mm Hasson trocar was  inserted and secured with a pursestring suture of #0 Vicryl.  Pneumoperitoneum was created. A video camera was inserted with visualization  and findings as described above. A 10 mm trocar was placed in the subxiphoid  region and two 5 mm trocars placed in the right upper quadrant. The  gallbladder fundus was elevated. We stripped all the adhesions off the  gallbladder very carefully. The infundibulum of the gallbladder was  elevated. We dissected out the scar tissue around the infundibulum. We very  carefully dissected out the cystic duct and the cystic artery. We isolated  the cystic duct and secured it with a metal clip. A cholangiogram catheter  was inserted into the cystic duct. Cholangiogram was obtained using the C-  arm. The cholangiogram was completely normal as described above. Normal  anatomy. No filling defects. Good  drainage of the duodenum. The  cholangiogram catheter was removed and the cystic duct was secured with  multiple clips and divided. We isolated the anterior branch and the  posterior branch of the cystic artery and  it entered the gallbladder,  secured these with metal clips and divided it. The gallbladder was then  dissected from its bed with electrocautery and removed through the umbilical  port. The operative field was copiously irrigated with saline. Hemostasis  was excellent and achieved with electrocautery. At the completion of the  case, there was no bleeding and no bile leak whatsoever. The irrigation  fluid was completely clear. All the trocar sites were inspected and there  was no bleeding. The trocars were removed under direct vision and there was  no bleeding. The pneumoperitoneum was released.  The fascia at the umbilicus  was closed with #0 Vicryl sutures. The skin incisions were closed with  subcuticular sutures of 4-0 Vicryl and Steri-Strips. Clean bandages were  placed, the patient taken to the recovery room in stable condition.  Estimated blood loss was about 20 cc. Complications none. Sponge, needle and  instrument counts were correct.                                               Angelia Mould. Derrell Lolling, M.D.    HMI/MEDQ  D:  11/17/2001  T:  11/17/2001  Job:  [id number]   cc:   Tinnie Gens A. Tawanna Cooler, M.D. [facility]   Jeannett Senior A. Clent Ridges, M.D. [facility]

## 2010-07-25 NOTE — Op Note (Signed)
NAME:  Derek Baker, Derek Baker NO.:  [id number]   MEDICAL RECORD NO.:  [id number]                   PATIENT TYPE:  AMB   LOCATION:  ENDO                                 FACILITY:  [facility]   PHYSICIAN:  Petra Kuba, M.D.                 DATE OF BIRTH:  05/23/1935   DATE OF PROCEDURE:  01/01/2003  DATE OF DISCHARGE:                                 OPERATIVE REPORT   PROCEDURE:  Colonoscopy with polypectomy.   INDICATIONS:  Patient with family history of colon cancer, personal history  of polyps, due for colonic screening.  Consent was signed after risks,  benefits, methods, and options thoroughly discussed in the office on  multiple occasions.   MEDICINES USED:  Demerol 80 mg, Versed 8 mg.   DESCRIPTION OF PROCEDURE:  Rectal inspection was pertinent for external  hemorrhoids, small.  Digital exam was negative.  The pediatric video  adjustable colonoscope was inserted and with various abdominal pressures  able to be advanced to the cecum.  No obvious abnormality was seen on  insertion.  The cecum was identified by the appendiceal orifice and the  ileocecal valve.  In fact, the scope was inserted a short way into the  terminal ileum, which was normal.  Photo documentation was obtained.  The  scope was then slowly withdrawn.  The prep was adequate.  There was some  liquid stool that required washing and suctioning.  On slow withdrawal  through the colon the cecum, ascending, and transverse were normal.  As the  scope was withdrawn around the left side of the colon, there were some rare  early left-sided diverticula seen and in the distal sigmoid and rectum,  three tiny probable hyperplastic-appearing polyps were seen and were all hot  biopsied and put in the same container.  Anorectal pull-through and  retroflexion confirmed some small hemorrhoids.  The scope was straightened  and readvanced a short way up the left side of the colon, air was suctioned,  and  the scope removed.  The patient tolerated the procedure well.  There was  no obvious immediate complication.   ENDOSCOPIC DIAGNOSES:  1. Internal-external hemorrhoids.  2. Rare early left-sided diverticula.  3. Three tiny rectal and distal sigmoid probable hyperplastic-appearing     polyps, hot biopsied.  4. Otherwise within normal limits to the terminal ileum.   PLAN:  Await pathology to determine future colonic screening, probably  recheck in five years.  Happy to see back p.r.n., otherwise return care to  Dr. Tawanna Cooler for the customary health care maintenance to include yearly rectals  and guaiacs.                                                 Petra Kuba, M.D.  MEM/MEDQ  D:  01/01/2003  T:  01/01/2003  Job:  [id number]   cc:   Tinnie Gens A. Tawanna Cooler, M.D. [facility]

## 2011-04-27 ENCOUNTER — Encounter: Payer: Self-pay | Admitting: Family Medicine

## 2011-04-27 ENCOUNTER — Ambulatory Visit (INDEPENDENT_AMBULATORY_CARE_PROVIDER_SITE_OTHER): Payer: Medicare Other | Admitting: Family Medicine

## 2011-04-27 DIAGNOSIS — R059 Cough, unspecified: Secondary | ICD-10-CM

## 2011-04-27 DIAGNOSIS — I10 Essential (primary) hypertension: Secondary | ICD-10-CM | POA: Diagnosis not present

## 2011-04-27 DIAGNOSIS — N401 Enlarged prostate with lower urinary tract symptoms: Secondary | ICD-10-CM

## 2011-04-27 DIAGNOSIS — L570 Actinic keratosis: Secondary | ICD-10-CM

## 2011-04-27 DIAGNOSIS — C801 Malignant (primary) neoplasm, unspecified: Secondary | ICD-10-CM

## 2011-04-27 DIAGNOSIS — R05 Cough: Secondary | ICD-10-CM | POA: Diagnosis not present

## 2011-04-27 DIAGNOSIS — H9190 Unspecified hearing loss, unspecified ear: Secondary | ICD-10-CM | POA: Diagnosis not present

## 2011-04-27 DIAGNOSIS — Z860100 Personal history of colon polyps, unspecified: Secondary | ICD-10-CM

## 2011-04-27 DIAGNOSIS — E785 Hyperlipidemia, unspecified: Secondary | ICD-10-CM

## 2011-04-27 DIAGNOSIS — Z0000 Encounter for general adult medical examination without abnormal findings: Secondary | ICD-10-CM | POA: Diagnosis not present

## 2011-04-27 DIAGNOSIS — N529 Male erectile dysfunction, unspecified: Secondary | ICD-10-CM

## 2011-04-27 LAB — BASIC METABOLIC PANEL
Calcium: 9.3 mg/dL (ref 8.4–10.5)
GFR: 86.73 mL/min (ref >60.00)
Potassium: 4.4 mEq/L (ref 3.5–5.1)
Sodium: 141 mEq/L (ref 135–145)

## 2011-04-27 LAB — POCT URINALYSIS DIPSTICK
Bilirubin, UA: NEGATIVE
Glucose, UA: NEGATIVE
Nitrite, UA: NEGATIVE
Urobilinogen, UA: 0.2

## 2011-04-27 LAB — CBC WITH DIFFERENTIAL/PLATELET
Basophils Absolute: 0.1 10*3/uL (ref 0.0–0.1)
Basophils Relative: 0.7 % (ref 0.0–3.0)
Eosinophils Absolute: 0.6 10*3/uL (ref 0.0–0.7)
HCT: 44.7 % (ref 39.0–52.0)
Hemoglobin: 14.9 g/dL (ref 13.0–17.0)
Lymphocytes Relative: 23.9 % (ref 12.0–46.0)
MCV: 84.9 fl (ref 78.0–100.0)
Monocytes Relative: 8 % (ref 3.0–12.0)
Neutrophils Relative %: 60.2 % (ref 43.0–77.0)
WBC: 7.9 10*3/uL (ref 4.5–10.5)

## 2011-04-27 LAB — TSH: TSH: 1.11 u[IU]/mL (ref 0.35–5.50)

## 2011-04-27 LAB — LIPID PANEL: VLDL: 24.2 mg/dL (ref 0.0–40.0)

## 2011-04-27 LAB — HEPATIC FUNCTION PANEL
ALT: 27 U/L (ref 0–53)
AST: 21 U/L (ref 0–37)
Bilirubin, Direct: 0.1 mg/dL (ref 0.0–0.3)
Total Bilirubin: 0.7 mg/dL (ref 0.3–1.2)

## 2011-04-27 MED ORDER — AMLODIPINE BESYLATE 10 MG PO TABS: 10.0000 mg | ORAL_TABLET | Freq: Every day | ORAL | Status: DC

## 2011-04-27 MED ORDER — LISINOPRIL 40 MG PO TABS: ORAL_TABLET | ORAL | Status: DC

## 2011-04-27 MED ORDER — HYDROCHLOROTHIAZIDE 25 MG PO TABS: 25.0000 mg | ORAL_TABLET | Freq: Every day | ORAL | Status: DC

## 2011-04-27 MED ORDER — SIMVASTATIN 20 MG PO TABS: 20.0000 mg | ORAL_TABLET | Freq: Every day | ORAL | Status: DC

## 2011-04-27 MED ORDER — PREDNISONE 20 MG PO TABS: ORAL_TABLET | ORAL | Status: DC

## 2011-04-27 MED ORDER — TADALAFIL 20 MG PO TABS: 20.0000 mg | ORAL_TABLET | Freq: Every day | ORAL | Status: DC | PRN

## 2011-04-27 NOTE — Patient Instructions (Signed)
Continue your current medications except cut the lisinopril in half  Check your blood pressure daily in the morning for 4 weeks goal 135/85  Today's blood pressure was 118/80,,,,,,, 2 low  Take the prednisone as directed to get rid of your post viral pneumonitis

## 2011-04-27 NOTE — Progress Notes (Signed)
Subjective:    Patient ID: Derek Baker, male    DOB: [birth year], 76 y.o.   MRN: [id number]  HPI  Derek Baker is a 76 year old married male nonsmoker retired who lives in [place] who comes in today for a Medicare wellness examination 76  He has a history of hypertension treated with Norvasc 10 mg daily, hydrochlorothiazide 25 mg daily, lisinopril 40 mg daily  BP 118/80  He takes simvastatin 20 mg each bedtime and aspirin tablet for hyperlipidemia  He also uses Cialis when necessary for erectile dysfunction  He had a cold last fall and he has a residual cough that won't go away. No fever sputum production. No history of asthma.  He gets routine eye care, he has a hearing aid in his right ear he is deaf in his left ear, regular dental care, routine colonoscopy, tetanus 2009, Pneumovax x2, seasonal flu shot 2012, shingles 2010.  He is retired cognitive function normal he walks on a daily basis. Home health safety reviewed no issues identified. No guns in the house. He does have a health care power of attorney and a living well.    Review of Systems  Constitutional: Negative.   HENT: Negative.   Eyes: Negative.   Respiratory: Negative.   Cardiovascular: Negative.   Gastrointestinal: Negative.   Genitourinary: Negative.   Musculoskeletal: Negative.   Skin: Negative.   Neurological: Negative.   Hematological: Negative.   Psychiatric/Behavioral: Negative.        Objective:   Physical Exam  Constitutional: He is oriented to person, place, and time. He appears well-developed and well-nourished.  HENT:  Head: Normocephalic and atraumatic.  Right Ear: External ear normal.  Nose: Nose normal.  Mouth/Throat: Oropharynx is clear and moist.       Deaf left ear  Eyes: Conjunctivae and EOM are normal. Pupils are equal, round, and reactive to light.  Neck: Normal range of motion. Neck supple. No JVD present. No tracheal deviation present. No thyromegaly present.    Cardiovascular: Normal rate, regular rhythm, normal heart sounds and intact distal pulses.  Exam reveals no gallop and no friction rub.   No murmur heard. Pulmonary/Chest: Effort normal and breath sounds normal. No stridor. No respiratory distress. He has no wheezes. He has no rales. He exhibits no tenderness.  Abdominal: Soft. Bowel sounds are normal. He exhibits no distension and no mass. There is no tenderness. There is no rebound and no guarding.  Genitourinary: Rectum normal and penis normal. Guaiac negative stool. No penile tenderness.       Prostate gland is 2+ symmetrical nonnodular BPH  Musculoskeletal: Normal range of motion. He exhibits no edema and no tenderness.  Lymphadenopathy:    He has no cervical adenopathy.  Neurological: He is alert and oriented to person, place, and time. He has normal reflexes. No cranial nerve deficit. He exhibits normal muscle tone.  Skin: Skin is warm and dry. No rash noted. No erythema. No pallor.       Total body skin exam normal scar left upper face from previous squamous cell carcinoma removal. He also sees his dermatologist on a yearly basis.  Psychiatric: He has a normal mood and affect. His behavior is normal. Judgment and thought content normal.          Assessment & Plan:  Healthy male  Hypertension decrease lisinopril from 40 mg a day to 20 because blood pressure is 118/80 and he is having episodes of lightheadedness when he stands up  Hyperlipidemia continue current  meds check labs  Erectile dysfunction continue Cialis  Hearing aid to continue followup by ENT  BPH with some outlet obstruction asymptomatic

## 2011-05-18 DIAGNOSIS — L570 Actinic keratosis: Secondary | ICD-10-CM | POA: Diagnosis not present

## 2011-05-18 DIAGNOSIS — Z85828 Personal history of other malignant neoplasm of skin: Secondary | ICD-10-CM | POA: Diagnosis not present

## 2011-05-18 DIAGNOSIS — L820 Inflamed seborrheic keratosis: Secondary | ICD-10-CM | POA: Diagnosis not present

## 2011-05-22 DIAGNOSIS — H903 Sensorineural hearing loss, bilateral: Secondary | ICD-10-CM | POA: Diagnosis not present

## 2011-05-22 DIAGNOSIS — H905 Unspecified sensorineural hearing loss: Secondary | ICD-10-CM | POA: Diagnosis not present

## 2011-07-29 DIAGNOSIS — R197 Diarrhea, unspecified: Secondary | ICD-10-CM | POA: Diagnosis not present

## 2011-07-29 DIAGNOSIS — R5383 Other fatigue: Secondary | ICD-10-CM | POA: Diagnosis not present

## 2011-07-29 DIAGNOSIS — IMO0001 Reserved for inherently not codable concepts without codable children: Secondary | ICD-10-CM | POA: Diagnosis not present

## 2011-07-29 DIAGNOSIS — R5381 Other malaise: Secondary | ICD-10-CM | POA: Diagnosis not present

## 2011-09-29 DIAGNOSIS — N401 Enlarged prostate with lower urinary tract symptoms: Secondary | ICD-10-CM | POA: Diagnosis not present

## 2011-09-29 DIAGNOSIS — N529 Male erectile dysfunction, unspecified: Secondary | ICD-10-CM | POA: Diagnosis not present

## 2011-09-29 DIAGNOSIS — N200 Calculus of kidney: Secondary | ICD-10-CM | POA: Diagnosis not present

## 2011-12-25 DIAGNOSIS — J019 Acute sinusitis, unspecified: Secondary | ICD-10-CM | POA: Diagnosis not present

## 2011-12-25 DIAGNOSIS — J209 Acute bronchitis, unspecified: Secondary | ICD-10-CM | POA: Diagnosis not present

## 2012-01-26 DIAGNOSIS — Z23 Encounter for immunization: Secondary | ICD-10-CM | POA: Diagnosis not present

## 2012-04-28 ENCOUNTER — Ambulatory Visit (INDEPENDENT_AMBULATORY_CARE_PROVIDER_SITE_OTHER): Payer: Medicare Other | Admitting: Family Medicine

## 2012-04-28 ENCOUNTER — Encounter: Payer: Self-pay | Admitting: Family Medicine

## 2012-04-28 VITALS — BP 160/90 | Temp 97.7°F | Ht 71.25 in | Wt 232.0 lb

## 2012-04-28 DIAGNOSIS — Z0000 Encounter for general adult medical examination without abnormal findings: Secondary | ICD-10-CM

## 2012-04-28 DIAGNOSIS — I10 Essential (primary) hypertension: Secondary | ICD-10-CM | POA: Diagnosis not present

## 2012-04-28 DIAGNOSIS — N401 Enlarged prostate with lower urinary tract symptoms: Secondary | ICD-10-CM

## 2012-04-28 DIAGNOSIS — E785 Hyperlipidemia, unspecified: Secondary | ICD-10-CM | POA: Diagnosis not present

## 2012-04-28 DIAGNOSIS — N529 Male erectile dysfunction, unspecified: Secondary | ICD-10-CM

## 2012-04-28 DIAGNOSIS — H9190 Unspecified hearing loss, unspecified ear: Secondary | ICD-10-CM

## 2012-04-28 LAB — HEPATIC FUNCTION PANEL
ALT: 31 U/L (ref 0–53)
AST: 24 U/L (ref 0–37)
Albumin: 3.9 g/dL (ref 3.5–5.2)
Total Protein: 7.3 g/dL (ref 6.0–8.3)

## 2012-04-28 LAB — POCT URINALYSIS DIPSTICK
Blood, UA: NEGATIVE
Spec Grav, UA: 1.02
Urobilinogen, UA: 0.2
pH, UA: 7

## 2012-04-28 LAB — LIPID PANEL
Cholesterol: 147 mg/dL (ref 0–200)
HDL: 44.7 mg/dL (ref >39.00)
Triglycerides: 111 mg/dL (ref 0.0–149.0)

## 2012-04-28 LAB — PSA: PSA: 0.46 ng/mL (ref 0.10–4.00)

## 2012-04-28 LAB — CBC WITH DIFFERENTIAL/PLATELET
Basophils Relative: 0.6 % (ref 0.0–3.0)
Eosinophils Relative: 4.3 % (ref 0.0–5.0)
HCT: 46.4 % (ref 39.0–52.0)
Lymphs Abs: 1.7 10*3/uL (ref 0.7–4.0)
MCV: 82.1 fl (ref 78.0–100.0)
Monocytes Absolute: 0.7 10*3/uL (ref 0.1–1.0)
Neutro Abs: 5.8 10*3/uL (ref 1.4–7.7)
Platelets: 255 10*3/uL (ref 150.0–400.0)
WBC: 8.6 10*3/uL (ref 4.5–10.5)

## 2012-04-28 LAB — BASIC METABOLIC PANEL
BUN: 16 mg/dL (ref 6–23)
Calcium: 9.5 mg/dL (ref 8.4–10.5)
Creatinine, Ser: 1 mg/dL (ref 0.4–1.5)
GFR: 79.4 mL/min (ref >60.00)
Glucose, Bld: 98 mg/dL (ref 70–99)
Sodium: 139 mEq/L (ref 135–145)

## 2012-04-28 MED ORDER — LISINOPRIL 40 MG PO TABS: ORAL_TABLET | ORAL | Status: DC

## 2012-04-28 MED ORDER — SIMVASTATIN 20 MG PO TABS: 20.0000 mg | ORAL_TABLET | Freq: Every day | ORAL | Status: DC

## 2012-04-28 MED ORDER — TADALAFIL 20 MG PO TABS: 20.0000 mg | ORAL_TABLET | Freq: Every day | ORAL | Status: DC | PRN

## 2012-04-28 MED ORDER — HYDROCHLOROTHIAZIDE 25 MG PO TABS: 25.0000 mg | ORAL_TABLET | Freq: Every day | ORAL | Status: DC

## 2012-04-28 MED ORDER — AMLODIPINE BESYLATE 10 MG PO TABS: 10.0000 mg | ORAL_TABLET | Freq: Every day | ORAL | Status: DC

## 2012-04-28 NOTE — Progress Notes (Signed)
  Subjective:    Patient ID: Derek Baker, male    DOB: 1935/11/07, 77 y.o.   MRN: [id number]  [name] is a 77 year old married male nonsmoker who comes in today for a Medicare wellness exam because of a history of hypertension, hyperlipidemia, erectile dysfunction, hearing loss  He states he's had a good year no problems except he was feels lightheaded for about 2 seconds when he gets up in the morning. He doesn't take his blood pressure medication until mid day. Advised to take all his medications in the morning  He gets routine eye care, dental care, bilateral hearing aids, colonoscopy and GI because of a history of polyps, vaccinations up-to-date  Cognitive function normal he walks on a regular basis home health safety reviewed no issues identified, no guns in the house, he does have a health care power of attorney and living will    Review of Systems  Constitutional: Negative.   HENT: Negative.   Eyes: Negative.   Respiratory: Negative.   Cardiovascular: Negative.   Gastrointestinal: Negative.   Genitourinary: Negative.   Musculoskeletal: Negative.   Skin: Negative.   Neurological: Negative.   Psychiatric/Behavioral: Negative.        Objective:   Physical Exam  Constitutional: He is oriented to person, place, and time. He appears well-developed and well-nourished.  HENT:  Head: Normocephalic and atraumatic.  Right Ear: External ear normal.  Left Ear: External ear normal.  Nose: Nose normal.  Mouth/Throat: Oropharynx is clear and moist.  Eyes: Conjunctivae and EOM are normal. Pupils are equal, round, and reactive to light.  Neck: Normal range of motion. Neck supple. No JVD present. No tracheal deviation present. No thyromegaly present.  Cardiovascular: Normal rate, regular rhythm, normal heart sounds and intact distal pulses.  Exam reveals no gallop and no friction rub.   No murmur heard. No carotid or aortic bruit peripheral pulses 2+ and symmetrical   Pulmonary/Chest: Effort normal and breath sounds normal. No stridor. No respiratory distress. He has no wheezes. He has no rales. He exhibits no tenderness.  Abdominal: Soft. Bowel sounds are normal. He exhibits no distension and no mass. There is no tenderness. There is no rebound and no guarding.  Genitourinary: Rectum normal, prostate normal and penis normal. Guaiac negative stool. No penile tenderness.  Musculoskeletal: Normal range of motion. He exhibits no edema and no tenderness.  Lymphadenopathy:    He has no cervical adenopathy.  Neurological: He is alert and oriented to person, place, and time. He has normal reflexes. No cranial nerve deficit. He exhibits normal muscle tone.  Skin: Skin is warm and dry. No rash noted. No erythema. No pallor.  Psychiatric: He has a normal mood and affect. His behavior is normal. Judgment and thought content normal.          Assessment & Plan:  Healthy male  Hypertension continue current therapy BP at home averages 135/85  Hyperlipidemia continue Zocor and aspirin check lipids  Erectile dysfunction continue Cialis when necessary  Hearing loss continue hearing aids

## 2012-04-28 NOTE — Patient Instructions (Signed)
Take plain Claritin 10 mg one daily in the morning  Take all your medications in the morning when you first get out of bed including the Zocor  Walk 30 minutes daily  Return in one year sooner if any problems

## 2012-05-16 ENCOUNTER — Encounter: Payer: Medicare Other | Admitting: Family Medicine

## 2012-05-16 DIAGNOSIS — L570 Actinic keratosis: Secondary | ICD-10-CM | POA: Diagnosis not present

## 2012-05-16 DIAGNOSIS — D239 Other benign neoplasm of skin, unspecified: Secondary | ICD-10-CM | POA: Diagnosis not present

## 2012-05-16 DIAGNOSIS — Z85828 Personal history of other malignant neoplasm of skin: Secondary | ICD-10-CM | POA: Diagnosis not present

## 2012-05-16 DIAGNOSIS — L821 Other seborrheic keratosis: Secondary | ICD-10-CM | POA: Diagnosis not present

## 2012-05-16 DIAGNOSIS — L819 Disorder of pigmentation, unspecified: Secondary | ICD-10-CM | POA: Diagnosis not present

## 2012-05-27 DIAGNOSIS — H832X9 Labyrinthine dysfunction, unspecified ear: Secondary | ICD-10-CM | POA: Diagnosis not present

## 2012-05-27 DIAGNOSIS — H903 Sensorineural hearing loss, bilateral: Secondary | ICD-10-CM | POA: Diagnosis not present

## 2012-05-27 DIAGNOSIS — H905 Unspecified sensorineural hearing loss: Secondary | ICD-10-CM | POA: Diagnosis not present

## 2012-09-29 DIAGNOSIS — N200 Calculus of kidney: Secondary | ICD-10-CM | POA: Diagnosis not present

## 2012-09-29 DIAGNOSIS — N529 Male erectile dysfunction, unspecified: Secondary | ICD-10-CM | POA: Diagnosis not present

## 2012-09-29 DIAGNOSIS — N401 Enlarged prostate with lower urinary tract symptoms: Secondary | ICD-10-CM | POA: Diagnosis not present

## 2012-12-16 DIAGNOSIS — Z23 Encounter for immunization: Secondary | ICD-10-CM | POA: Diagnosis not present

## 2013-04-14 ENCOUNTER — Other Ambulatory Visit: Payer: Self-pay | Admitting: Family Medicine

## 2013-05-15 ENCOUNTER — Ambulatory Visit (INDEPENDENT_AMBULATORY_CARE_PROVIDER_SITE_OTHER): Payer: Medicare Other | Admitting: Family Medicine

## 2013-05-15 ENCOUNTER — Encounter: Payer: Self-pay | Admitting: Family Medicine

## 2013-05-15 VITALS — BP 152/80 | HR 77 | Temp 97.7°F | Resp 20 | Ht 71.0 in | Wt 234.0 lb

## 2013-05-15 DIAGNOSIS — N401 Enlarged prostate with lower urinary tract symptoms: Secondary | ICD-10-CM | POA: Diagnosis not present

## 2013-05-15 DIAGNOSIS — R351 Nocturia: Secondary | ICD-10-CM | POA: Diagnosis not present

## 2013-05-15 DIAGNOSIS — Z0000 Encounter for general adult medical examination without abnormal findings: Secondary | ICD-10-CM | POA: Diagnosis not present

## 2013-05-15 DIAGNOSIS — I10 Essential (primary) hypertension: Secondary | ICD-10-CM | POA: Diagnosis not present

## 2013-05-15 DIAGNOSIS — H9190 Unspecified hearing loss, unspecified ear: Secondary | ICD-10-CM

## 2013-05-15 DIAGNOSIS — N529 Male erectile dysfunction, unspecified: Secondary | ICD-10-CM

## 2013-05-15 DIAGNOSIS — E785 Hyperlipidemia, unspecified: Secondary | ICD-10-CM | POA: Diagnosis not present

## 2013-05-15 DIAGNOSIS — N138 Other obstructive and reflux uropathy: Secondary | ICD-10-CM

## 2013-05-15 DIAGNOSIS — Z23 Encounter for immunization: Secondary | ICD-10-CM | POA: Diagnosis not present

## 2013-05-15 LAB — CBC WITH DIFFERENTIAL/PLATELET
BASOS PCT: 0.3 % (ref 0.0–3.0)
Basophils Absolute: 0 10*3/uL (ref 0.0–0.1)
Eosinophils Absolute: 0.4 10*3/uL (ref 0.0–0.7)
Eosinophils Relative: 5 % (ref 0.0–5.0)
HCT: 45.3 % (ref 39.0–52.0)
HEMOGLOBIN: 15.1 g/dL (ref 13.0–17.0)
LYMPHS ABS: 1.5 10*3/uL (ref 0.7–4.0)
LYMPHS PCT: 18.1 % (ref 12.0–46.0)
MCHC: 33.2 g/dL (ref 30.0–36.0)
MCV: 83.8 fl (ref 78.0–100.0)
MONOS PCT: 8.5 % (ref 3.0–12.0)
Monocytes Absolute: 0.7 10*3/uL (ref 0.1–1.0)
NEUTROS ABS: 5.8 10*3/uL (ref 1.4–7.7)
Neutrophils Relative %: 68.1 % (ref 43.0–77.0)
Platelets: 252 10*3/uL (ref 150.0–400.0)
RBC: 5.41 Mil/uL (ref 4.22–5.81)
RDW: 15.3 % — ABNORMAL HIGH (ref 11.5–14.6)
WBC: 8.5 10*3/uL (ref 4.5–10.5)

## 2013-05-15 LAB — BASIC METABOLIC PANEL
BUN: 21 mg/dL (ref 6–23)
CALCIUM: 9.6 mg/dL (ref 8.4–10.5)
CHLORIDE: 104 meq/L (ref 96–112)
CO2: 31 mEq/L (ref 19–32)
CREATININE: 1 mg/dL (ref 0.4–1.5)
GFR: 77.35 mL/min (ref >60.00)
Glucose, Bld: 111 mg/dL — ABNORMAL HIGH (ref 70–99)
Potassium: 4.5 mEq/L (ref 3.5–5.1)
Sodium: 141 mEq/L (ref 135–145)

## 2013-05-15 LAB — LIPID PANEL
CHOLESTEROL: 155 mg/dL (ref 0–200)
HDL: 45.2 mg/dL (ref >39.00)
LDL CALC: 87 mg/dL (ref 0–99)
Total CHOL/HDL Ratio: 3
Triglycerides: 114 mg/dL (ref 0.0–149.0)
VLDL: 22.8 mg/dL (ref 0.0–40.0)

## 2013-05-15 LAB — POCT URINALYSIS DIPSTICK
Bilirubin, UA: NEGATIVE
GLUCOSE UA: NEGATIVE
KETONES UA: NEGATIVE
Leukocytes, UA: NEGATIVE
Nitrite, UA: NEGATIVE
RBC UA: NEGATIVE
SPEC GRAV UA: 1.025
Urobilinogen, UA: 0.2
pH, UA: 5.5

## 2013-05-15 LAB — COMPREHENSIVE METABOLIC PANEL
ALT: 26 U/L (ref 0–53)
AST: 23 U/L (ref 0–37)
Albumin: 4 g/dL (ref 3.5–5.2)
Alkaline Phosphatase: 69 U/L (ref 39–117)
BILIRUBIN TOTAL: 0.8 mg/dL (ref 0.3–1.2)
BUN: 21 mg/dL (ref 6–23)
CO2: 31 mEq/L (ref 19–32)
Calcium: 9.7 mg/dL (ref 8.4–10.5)
Chloride: 104 mEq/L (ref 96–112)
Creatinine, Ser: 1 mg/dL (ref 0.4–1.5)
GFR: 77.35 mL/min (ref >60.00)
Glucose, Bld: 113 mg/dL — ABNORMAL HIGH (ref 70–99)
Potassium: 4.4 mEq/L (ref 3.5–5.1)
SODIUM: 142 meq/L (ref 135–145)
TOTAL PROTEIN: 7.7 g/dL (ref 6.0–8.3)

## 2013-05-15 LAB — HEPATIC FUNCTION PANEL
ALK PHOS: 72 U/L (ref 39–117)
ALT: 29 U/L (ref 0–53)
AST: 22 U/L (ref 0–37)
Albumin: 4 g/dL (ref 3.5–5.2)
BILIRUBIN DIRECT: 0.1 mg/dL (ref 0.0–0.3)
BILIRUBIN TOTAL: 0.7 mg/dL (ref 0.3–1.2)
Total Protein: 7.3 g/dL (ref 6.0–8.3)

## 2013-05-15 LAB — TSH: TSH: 1.6 u[IU]/mL (ref 0.35–5.50)

## 2013-05-15 LAB — PSA: PSA: 0.46 ng/mL (ref 0.10–4.00)

## 2013-05-15 MED ORDER — AMLODIPINE BESYLATE 10 MG PO TABS: ORAL_TABLET | ORAL | Status: DC

## 2013-05-15 MED ORDER — LISINOPRIL 40 MG PO TABS: ORAL_TABLET | ORAL | Status: DC

## 2013-05-15 MED ORDER — VARDENAFIL HCL 20 MG PO TABS: 20.0000 mg | ORAL_TABLET | ORAL | Status: DC | PRN

## 2013-05-15 MED ORDER — HYDROCHLOROTHIAZIDE 25 MG PO TABS: ORAL_TABLET | ORAL | Status: DC

## 2013-05-15 MED ORDER — SIMVASTATIN 20 MG PO TABS: 20.0000 mg | ORAL_TABLET | Freq: Every day | ORAL | Status: DC

## 2013-05-15 NOTE — Progress Notes (Signed)
Pre-visit discussion using our clinic review tool. No additional management support is needed unless otherwise documented below in the visit note.  

## 2013-05-15 NOTE — Patient Instructions (Signed)
Continue current medications except switch to Levitra  Return in one year sooner if any problems

## 2013-05-15 NOTE — Addendum Note (Signed)
Addended by: Marian Sorrow on: 05/15/2013 02:40 PM   Modules accepted: Orders

## 2013-05-15 NOTE — Progress Notes (Signed)
   Subjective:    Patient ID: Derek Baker, male    DOB: 07-24-1935, 78 y.o.   MRN: [id number]  HPI Derek Baker is a 78 year old married male nonsmoker who comes in today for a Medicare wellness examination because of a history of hypertension, hyperlipidemia, and erectile dysfunction  On 10 mg of Norvasc, lisinopril to 20 mg, and hydrochlorothiazide 25 mg his blood pressure is 152/80. At home his blood pressures are 510 systolic over 80.  He doesn't feel like to see D[place] Clap is working well he would try like to try another product.  He's trying to walk on a daily basis  He gets routine eye care, dental care, colonoscopy and GI, vaccinations updated by Butch Penny. He was given a Prevnar 13 today.  Cognitive function normal he walks on a daily basis home health safety reviewed no issues identified, no guns in the house, he does have a health care power of attorney and living well   Review of Systems  Constitutional: Negative.   HENT: Negative.   Eyes: Negative.   Respiratory: Negative.   Cardiovascular: Negative.   Gastrointestinal: Negative.   Endocrine: Negative.   Genitourinary: Negative.   Musculoskeletal: Negative.   Skin: Negative.   Allergic/Immunologic: Negative.   Neurological: Negative.   Hematological: Negative.   Psychiatric/Behavioral: Negative.        Objective:   Physical Exam  Nursing note and vitals reviewed. Constitutional: He is oriented to person, place, and time. He appears well-developed and well-nourished.  HENT:  Head: Normocephalic and atraumatic.  Right Ear: External ear normal.  Left Ear: External ear normal.  Nose: Nose normal.  Mouth/Throat: Oropharynx is clear and moist.  Eyes: Conjunctivae and EOM are normal. Pupils are equal, round, and reactive to light.  Neck: Normal range of motion. Neck supple. No JVD present. No tracheal deviation present. No thyromegaly present.  Cardiovascular: Normal rate, regular rhythm, normal heart sounds and intact  distal pulses.  Exam reveals no gallop and no friction rub.   No murmur heard. Pulmonary/Chest: Effort normal and breath sounds normal. No stridor. No respiratory distress. He has no wheezes. He has no rales. He exhibits no tenderness.  Abdominal: Soft. Bowel sounds are normal. He exhibits no distension and no mass. There is no tenderness. There is no rebound and no guarding.  Genitourinary: Rectum normal and penis normal. Guaiac negative stool. No penile tenderness.  2+ BPH some nocturia  Musculoskeletal: Normal range of motion. He exhibits no edema and no tenderness.  Lymphadenopathy:    He has no cervical adenopathy.  Neurological: He is alert and oriented to person, place, and time. He has normal reflexes. No cranial nerve deficit. He exhibits normal muscle tone.  Skin: Skin is warm and dry. No rash noted. No erythema. No pallor.  Total body skin exam normal he says scar left lateral face next the eye we had a basal cell removed a couple years ago but otherwise total body skin exam looks normal  Psychiatric: He has a normal mood and affect. His behavior is normal. Judgment and thought content normal.          Assessment & Plan:  Healthy male  Hypertension continue current medications  Hyperlipidemia check labs  Erectile dysfunction switch to Levitra  Hearing loss right ear continue hearing aid  History of basal cell carcinoma the face skin exam normal today

## 2013-05-16 ENCOUNTER — Telehealth: Payer: Self-pay | Admitting: Family Medicine

## 2013-05-16 NOTE — Telephone Encounter (Signed)
Relevant patient education assigned to patient using Emmi. ° °

## 2013-05-18 DIAGNOSIS — L570 Actinic keratosis: Secondary | ICD-10-CM | POA: Diagnosis not present

## 2013-05-18 DIAGNOSIS — L738 Other specified follicular disorders: Secondary | ICD-10-CM | POA: Diagnosis not present

## 2013-05-18 DIAGNOSIS — Z85828 Personal history of other malignant neoplasm of skin: Secondary | ICD-10-CM | POA: Diagnosis not present

## 2013-05-18 DIAGNOSIS — L678 Other hair color and hair shaft abnormalities: Secondary | ICD-10-CM | POA: Diagnosis not present

## 2013-05-18 DIAGNOSIS — L821 Other seborrheic keratosis: Secondary | ICD-10-CM | POA: Diagnosis not present

## 2013-05-18 DIAGNOSIS — D239 Other benign neoplasm of skin, unspecified: Secondary | ICD-10-CM | POA: Diagnosis not present

## 2013-05-18 DIAGNOSIS — L819 Disorder of pigmentation, unspecified: Secondary | ICD-10-CM | POA: Diagnosis not present

## 2013-05-19 ENCOUNTER — Other Ambulatory Visit: Payer: Self-pay | Admitting: Gastroenterology

## 2013-05-19 DIAGNOSIS — D126 Benign neoplasm of colon, unspecified: Secondary | ICD-10-CM | POA: Diagnosis not present

## 2013-05-19 DIAGNOSIS — K5730 Diverticulosis of large intestine without perforation or abscess without bleeding: Secondary | ICD-10-CM | POA: Diagnosis not present

## 2013-05-19 DIAGNOSIS — Z86010 Personal history of colonic polyps: Secondary | ICD-10-CM | POA: Diagnosis not present

## 2013-05-19 DIAGNOSIS — Z09 Encounter for follow-up examination after completed treatment for conditions other than malignant neoplasm: Secondary | ICD-10-CM | POA: Diagnosis not present

## 2013-05-30 DIAGNOSIS — H903 Sensorineural hearing loss, bilateral: Secondary | ICD-10-CM | POA: Diagnosis not present

## 2013-05-30 DIAGNOSIS — H832X9 Labyrinthine dysfunction, unspecified ear: Secondary | ICD-10-CM | POA: Diagnosis not present

## 2013-05-30 DIAGNOSIS — H6120 Impacted cerumen, unspecified ear: Secondary | ICD-10-CM | POA: Diagnosis not present

## 2013-05-30 DIAGNOSIS — H905 Unspecified sensorineural hearing loss: Secondary | ICD-10-CM | POA: Diagnosis not present

## 2013-10-21 ENCOUNTER — Other Ambulatory Visit: Payer: Self-pay | Admitting: Family Medicine

## 2013-11-03 DIAGNOSIS — R42 Dizziness and giddiness: Secondary | ICD-10-CM | POA: Diagnosis not present

## 2013-11-03 DIAGNOSIS — I10 Essential (primary) hypertension: Secondary | ICD-10-CM | POA: Diagnosis not present

## 2013-11-14 DIAGNOSIS — H905 Unspecified sensorineural hearing loss: Secondary | ICD-10-CM | POA: Diagnosis not present

## 2013-11-14 DIAGNOSIS — H60339 Swimmer's ear, unspecified ear: Secondary | ICD-10-CM | POA: Diagnosis not present

## 2013-11-14 DIAGNOSIS — H6120 Impacted cerumen, unspecified ear: Secondary | ICD-10-CM | POA: Diagnosis not present

## 2013-11-14 DIAGNOSIS — H903 Sensorineural hearing loss, bilateral: Secondary | ICD-10-CM | POA: Diagnosis not present

## 2013-12-11 DIAGNOSIS — H903 Sensorineural hearing loss, bilateral: Secondary | ICD-10-CM | POA: Diagnosis not present

## 2013-12-11 DIAGNOSIS — I10 Essential (primary) hypertension: Secondary | ICD-10-CM | POA: Diagnosis not present

## 2013-12-11 DIAGNOSIS — H9190 Unspecified hearing loss, unspecified ear: Secondary | ICD-10-CM | POA: Diagnosis not present

## 2013-12-11 DIAGNOSIS — Z79899 Other long term (current) drug therapy: Secondary | ICD-10-CM | POA: Diagnosis not present

## 2013-12-11 DIAGNOSIS — H832X2 Labyrinthine dysfunction, left ear: Secondary | ICD-10-CM | POA: Diagnosis not present

## 2013-12-11 DIAGNOSIS — H9071 Mixed conductive and sensorineural hearing loss, unilateral, right ear, with unrestricted hearing on the contralateral side: Secondary | ICD-10-CM | POA: Diagnosis not present

## 2013-12-11 DIAGNOSIS — H60333 Swimmer's ear, bilateral: Secondary | ICD-10-CM | POA: Diagnosis not present

## 2013-12-11 DIAGNOSIS — H6121 Impacted cerumen, right ear: Secondary | ICD-10-CM | POA: Diagnosis not present

## 2013-12-11 DIAGNOSIS — H905 Unspecified sensorineural hearing loss: Secondary | ICD-10-CM | POA: Diagnosis not present

## 2013-12-11 DIAGNOSIS — Z7982 Long term (current) use of aspirin: Secondary | ICD-10-CM | POA: Diagnosis not present

## 2014-01-23 DIAGNOSIS — Z23 Encounter for immunization: Secondary | ICD-10-CM | POA: Diagnosis not present

## 2014-03-27 DIAGNOSIS — N5201 Erectile dysfunction due to arterial insufficiency: Secondary | ICD-10-CM | POA: Diagnosis not present

## 2014-03-27 DIAGNOSIS — R351 Nocturia: Secondary | ICD-10-CM | POA: Diagnosis not present

## 2014-03-27 DIAGNOSIS — N200 Calculus of kidney: Secondary | ICD-10-CM | POA: Diagnosis not present

## 2014-03-27 DIAGNOSIS — N401 Enlarged prostate with lower urinary tract symptoms: Secondary | ICD-10-CM | POA: Diagnosis not present

## 2014-04-25 ENCOUNTER — Telehealth: Payer: Self-pay | Admitting: Family Medicine

## 2014-04-25 ENCOUNTER — Other Ambulatory Visit: Payer: Self-pay

## 2014-04-25 DIAGNOSIS — I10 Essential (primary) hypertension: Secondary | ICD-10-CM

## 2014-04-25 MED ORDER — SIMVASTATIN 20 MG PO TABS: 20.0000 mg | ORAL_TABLET | Freq: Every day | ORAL | Status: DC

## 2014-04-25 MED ORDER — HYDROCHLOROTHIAZIDE 25 MG PO TABS: ORAL_TABLET | ORAL | Status: DC

## 2014-04-25 MED ORDER — LISINOPRIL 40 MG PO TABS: ORAL_TABLET | ORAL | Status: DC

## 2014-04-25 MED ORDER — AMLODIPINE BESYLATE 10 MG PO TABS: ORAL_TABLET | ORAL | Status: DC

## 2014-04-25 NOTE — Telephone Encounter (Signed)
Pt has new pharm [facility]. Pt needs new rxs amlopidine 10 mg, hctz 25 mg, lisinopril 40 mg, simvastatin 20 mg #90 each w/refills fax [facility] [phone]. Please  included patient ID # [id number]

## 2014-04-25 NOTE — Telephone Encounter (Signed)
Rx sent to pharmacy for 90 day supply to [facility].

## 2014-04-25 NOTE — Telephone Encounter (Signed)
Prescriptions printed and faxed to pharmacy

## 2014-04-25 NOTE — Telephone Encounter (Signed)
Can you please print and fax these prescriptions?

## 2014-05-21 ENCOUNTER — Encounter: Payer: Self-pay | Admitting: Family Medicine

## 2014-05-21 ENCOUNTER — Ambulatory Visit (INDEPENDENT_AMBULATORY_CARE_PROVIDER_SITE_OTHER): Payer: Medicare Other | Admitting: Family Medicine

## 2014-05-21 VITALS — BP 130/90 | Temp 97.8°F | Ht 71.5 in | Wt 231.0 lb

## 2014-05-21 DIAGNOSIS — I10 Essential (primary) hypertension: Secondary | ICD-10-CM | POA: Diagnosis not present

## 2014-05-21 DIAGNOSIS — Z85828 Personal history of other malignant neoplasm of skin: Secondary | ICD-10-CM | POA: Diagnosis not present

## 2014-05-21 DIAGNOSIS — N401 Enlarged prostate with lower urinary tract symptoms: Secondary | ICD-10-CM

## 2014-05-21 DIAGNOSIS — E785 Hyperlipidemia, unspecified: Secondary | ICD-10-CM | POA: Diagnosis not present

## 2014-05-21 DIAGNOSIS — L570 Actinic keratosis: Secondary | ICD-10-CM | POA: Diagnosis not present

## 2014-05-21 DIAGNOSIS — L812 Freckles: Secondary | ICD-10-CM | POA: Diagnosis not present

## 2014-05-21 DIAGNOSIS — Z0000 Encounter for general adult medical examination without abnormal findings: Secondary | ICD-10-CM | POA: Diagnosis not present

## 2014-05-21 DIAGNOSIS — R351 Nocturia: Secondary | ICD-10-CM | POA: Diagnosis not present

## 2014-05-21 DIAGNOSIS — L821 Other seborrheic keratosis: Secondary | ICD-10-CM | POA: Diagnosis not present

## 2014-05-21 LAB — HEPATIC FUNCTION PANEL
ALT: 28 U/L (ref 0–53)
AST: 24 U/L (ref 0–37)
Albumin: 4.1 g/dL (ref 3.5–5.2)
Alkaline Phosphatase: 73 U/L (ref 39–117)
BILIRUBIN TOTAL: 0.7 mg/dL (ref 0.2–1.2)
Bilirubin, Direct: 0.2 mg/dL (ref 0.0–0.3)
Total Protein: 7 g/dL (ref 6.0–8.3)

## 2014-05-21 LAB — POCT URINALYSIS DIPSTICK
Bilirubin, UA: NEGATIVE
Glucose, UA: NEGATIVE
Ketones, UA: NEGATIVE
LEUKOCYTES UA: NEGATIVE
NITRITE UA: NEGATIVE
PH UA: 7
Spec Grav, UA: 1.015
UROBILINOGEN UA: 0.2

## 2014-05-21 LAB — TSH: TSH: 1.36 u[IU]/mL (ref 0.35–4.50)

## 2014-05-21 LAB — BASIC METABOLIC PANEL
BUN: 16 mg/dL (ref 6–23)
CALCIUM: 9.2 mg/dL (ref 8.4–10.5)
CO2: 34 mEq/L — ABNORMAL HIGH (ref 19–32)
CREATININE: 0.9 mg/dL (ref 0.40–1.50)
Chloride: 99 mEq/L (ref 96–112)
GFR: 87.11 mL/min (ref >60.00)
GLUCOSE: 86 mg/dL (ref 70–99)
Potassium: 3.8 mEq/L (ref 3.5–5.1)
Sodium: 137 mEq/L (ref 135–145)

## 2014-05-21 LAB — CBC WITH DIFFERENTIAL/PLATELET
Basophils Absolute: 0 10*3/uL (ref 0.0–0.1)
Basophils Relative: 0.5 % (ref 0.0–3.0)
Eosinophils Absolute: 0.5 10*3/uL (ref 0.0–0.7)
Eosinophils Relative: 7 % — ABNORMAL HIGH (ref 0.0–5.0)
HCT: 43.8 % (ref 39.0–52.0)
Hemoglobin: 14.7 g/dL (ref 13.0–17.0)
Lymphocytes Relative: 28.5 % (ref 12.0–46.0)
Lymphs Abs: 2.2 10*3/uL (ref 0.7–4.0)
MCHC: 33.6 g/dL (ref 30.0–36.0)
MCV: 82.6 fl (ref 78.0–100.0)
MONO ABS: 0.8 10*3/uL (ref 0.1–1.0)
Monocytes Relative: 10.1 % (ref 3.0–12.0)
NEUTROS PCT: 53.9 % (ref 43.0–77.0)
Neutro Abs: 4.1 10*3/uL (ref 1.4–7.7)
Platelets: 219 10*3/uL (ref 150.0–400.0)
RBC: 5.3 Mil/uL (ref 4.22–5.81)
RDW: 14.9 % (ref 11.5–15.5)
WBC: 7.6 10*3/uL (ref 4.0–10.5)

## 2014-05-21 LAB — LIPID PANEL
Cholesterol: 156 mg/dL (ref 0–200)
HDL: 53.7 mg/dL (ref >39.00)
LDL Cholesterol: 79 mg/dL (ref 0–99)
NonHDL: 102.3
Total CHOL/HDL Ratio: 3
Triglycerides: 117 mg/dL (ref 0.0–149.0)
VLDL: 23.4 mg/dL (ref 0.0–40.0)

## 2014-05-21 LAB — PSA: PSA: 0.54 ng/mL (ref 0.10–4.00)

## 2014-05-21 MED ORDER — AMLODIPINE BESYLATE 5 MG PO TABS: 5.0000 mg | ORAL_TABLET | Freq: Every day | ORAL | Status: DC

## 2014-05-21 MED ORDER — LISINOPRIL 20 MG PO TABS: 20.0000 mg | ORAL_TABLET | Freq: Every day | ORAL | Status: DC

## 2014-05-21 MED ORDER — SIMVASTATIN 20 MG PO TABS: 20.0000 mg | ORAL_TABLET | Freq: Every day | ORAL | Status: DC

## 2014-05-21 MED ORDER — HYDROCHLOROTHIAZIDE 25 MG PO TABS
ORAL_TABLET | ORAL | Status: DC
Start: 2014-05-21 — End: 2015-07-18

## 2014-05-21 NOTE — Progress Notes (Signed)
Pre visit review using our clinic review tool, if applicable. No additional management support is needed unless otherwise documented below in the visit note. 

## 2014-05-21 NOTE — Patient Instructions (Signed)
Continue current medications  Follow-up in one year sooner if any problems  [employer]......Marland Kitchen our new nurse practitioner from [facility]

## 2014-05-21 NOTE — Progress Notes (Signed)
   Subjective:    Patient ID: Derek Baker, male    DOB: October 02, 1935, 79 y.o.   MRN: [id number]  HPI Derek Baker is a 79 year old married male nonsmoker who comes in today for general physical examination because of a history of hypertension, hyperlipidemia, erectile dysfunction  He was at the beach last year and developed vertigo. He went to local emergency room by the time he got there and it resolved however they told him his blood pressure was too low and decreased his Norvasc from 10 mg today to 5 mg a day. BP 130/90  He also takes Zocor 20 mg a day for hyperlipidemia  He took Levitra but he had side effects. Dr. Bunnie Philips his urologist gave him the vacuum pump.  He gets routine eye care, dental care, colonoscopy and GI, vaccinations are up-to-date  Cognitive function normal he walks on a regular basis home health safety reviewed no issues identified, no guns in the house, he does have a healthcare power of attorney and living well   Review of Systems  Constitutional: Negative.   HENT: Negative.   Eyes: Negative.   Respiratory: Negative.   Cardiovascular: Negative.   Gastrointestinal: Negative.   Endocrine: Negative.   Genitourinary: Negative.   Musculoskeletal: Negative.   Skin: Negative.   Allergic/Immunologic: Negative.   Neurological: Negative.   Hematological: Negative.   Psychiatric/Behavioral: Negative.        Objective:   Physical Exam  Constitutional: He is oriented to person, place, and time. He appears well-developed and well-nourished.  HENT:  Head: Normocephalic and atraumatic.  Right Ear: External ear normal.  Left Ear: External ear normal.  Nose: Nose normal.  Mouth/Throat: Oropharynx is clear and moist.  Eyes: Conjunctivae and EOM are normal. Pupils are equal, round, and reactive to light.  Neck: Normal range of motion. Neck supple. No JVD present. No tracheal deviation present. No thyromegaly present.  Cardiovascular: Normal rate, regular rhythm, normal  heart sounds and intact distal pulses.  Exam reveals no gallop and no friction rub.   No murmur heard. No carotid nor aortic bruits peripheral pulses 2+ and symmetrical  Pulmonary/Chest: Effort normal and breath sounds normal. No stridor. No respiratory distress. He has no wheezes. He has no rales. He exhibits no tenderness.  Abdominal: Soft. Bowel sounds are normal. He exhibits no distension and no mass. There is no tenderness. There is no rebound and no guarding.  Genitourinary: Rectum normal, prostate normal and penis normal. Guaiac negative stool. No penile tenderness.  Musculoskeletal: Normal range of motion. He exhibits no edema or tenderness.  Lymphadenopathy:    He has no cervical adenopathy.  Neurological: He is alert and oriented to person, place, and time. He has normal reflexes. No cranial nerve deficit. He exhibits normal muscle tone.  Skin: Skin is warm and dry. No rash noted. No erythema. No pallor.  Psychiatric: He has a normal mood and affect. His behavior is normal. Judgment and thought content normal.  Nursing note and vitals reviewed.         Assessment & Plan:  Healthy male  Hypertension at goal,,,,,,,,, continue current therapy  Hyperlipidemia goal,,,,,,, continue current therapy  Hearing loss,,,,,,,, continue hearing aids,,,,, follow-up at [facility]  BPH with nocturia and ED,,,,,,,, followed by Dr. Bunnie Philips

## 2014-06-11 DIAGNOSIS — H6121 Impacted cerumen, right ear: Secondary | ICD-10-CM | POA: Diagnosis not present

## 2014-06-11 DIAGNOSIS — H832X9 Labyrinthine dysfunction, unspecified ear: Secondary | ICD-10-CM | POA: Diagnosis not present

## 2014-06-11 DIAGNOSIS — Z7982 Long term (current) use of aspirin: Secondary | ICD-10-CM | POA: Diagnosis not present

## 2014-06-11 DIAGNOSIS — H918X1 Other specified hearing loss, right ear: Secondary | ICD-10-CM | POA: Diagnosis not present

## 2014-06-11 DIAGNOSIS — H903 Sensorineural hearing loss, bilateral: Secondary | ICD-10-CM | POA: Diagnosis not present

## 2014-06-11 DIAGNOSIS — I10 Essential (primary) hypertension: Secondary | ICD-10-CM | POA: Diagnosis not present

## 2014-06-11 DIAGNOSIS — H905 Unspecified sensorineural hearing loss: Secondary | ICD-10-CM | POA: Diagnosis not present

## 2015-01-14 DIAGNOSIS — H903 Sensorineural hearing loss, bilateral: Secondary | ICD-10-CM | POA: Diagnosis not present

## 2015-01-14 DIAGNOSIS — I10 Essential (primary) hypertension: Secondary | ICD-10-CM | POA: Diagnosis not present

## 2015-01-14 DIAGNOSIS — H9041 Sensorineural hearing loss, unilateral, right ear, with unrestricted hearing on the contralateral side: Secondary | ICD-10-CM | POA: Diagnosis not present

## 2015-01-14 DIAGNOSIS — H6121 Impacted cerumen, right ear: Secondary | ICD-10-CM | POA: Diagnosis not present

## 2015-01-14 DIAGNOSIS — Z79899 Other long term (current) drug therapy: Secondary | ICD-10-CM | POA: Diagnosis not present

## 2015-01-14 DIAGNOSIS — H832X3 Labyrinthine dysfunction, bilateral: Secondary | ICD-10-CM | POA: Diagnosis not present

## 2015-02-13 DIAGNOSIS — Z23 Encounter for immunization: Secondary | ICD-10-CM | POA: Diagnosis not present

## 2015-03-10 DIAGNOSIS — H3530 Unspecified macular degeneration: Secondary | ICD-10-CM

## 2015-03-10 HISTORY — DX: Unspecified macular degeneration: H35.30

## 2015-03-29 DIAGNOSIS — H01002 Unspecified blepharitis right lower eyelid: Secondary | ICD-10-CM | POA: Diagnosis not present

## 2015-03-29 DIAGNOSIS — H524 Presbyopia: Secondary | ICD-10-CM | POA: Diagnosis not present

## 2015-03-29 DIAGNOSIS — N5201 Erectile dysfunction due to arterial insufficiency: Secondary | ICD-10-CM | POA: Diagnosis not present

## 2015-03-29 DIAGNOSIS — H2513 Age-related nuclear cataract, bilateral: Secondary | ICD-10-CM | POA: Diagnosis not present

## 2015-03-29 DIAGNOSIS — Z0000 Encounter for general adult medical examination without abnormal findings: Secondary | ICD-10-CM | POA: Diagnosis not present

## 2015-03-29 DIAGNOSIS — N200 Calculus of kidney: Secondary | ICD-10-CM | POA: Diagnosis not present

## 2015-03-29 DIAGNOSIS — R3912 Poor urinary stream: Secondary | ICD-10-CM | POA: Diagnosis not present

## 2015-03-29 DIAGNOSIS — H353132 Nonexudative age-related macular degeneration, bilateral, intermediate dry stage: Secondary | ICD-10-CM | POA: Diagnosis not present

## 2015-05-21 DIAGNOSIS — D485 Neoplasm of uncertain behavior of skin: Secondary | ICD-10-CM | POA: Diagnosis not present

## 2015-05-21 DIAGNOSIS — L821 Other seborrheic keratosis: Secondary | ICD-10-CM | POA: Diagnosis not present

## 2015-05-21 DIAGNOSIS — L812 Freckles: Secondary | ICD-10-CM | POA: Diagnosis not present

## 2015-05-21 DIAGNOSIS — D1801 Hemangioma of skin and subcutaneous tissue: Secondary | ICD-10-CM | POA: Diagnosis not present

## 2015-05-21 DIAGNOSIS — D225 Melanocytic nevi of trunk: Secondary | ICD-10-CM | POA: Diagnosis not present

## 2015-05-21 DIAGNOSIS — L570 Actinic keratosis: Secondary | ICD-10-CM | POA: Diagnosis not present

## 2015-05-21 DIAGNOSIS — C430 Malignant melanoma of lip: Secondary | ICD-10-CM | POA: Diagnosis not present

## 2015-05-21 DIAGNOSIS — Z85828 Personal history of other malignant neoplasm of skin: Secondary | ICD-10-CM | POA: Diagnosis not present

## 2015-05-21 DIAGNOSIS — D2272 Melanocytic nevi of left lower limb, including hip: Secondary | ICD-10-CM | POA: Diagnosis not present

## 2015-05-21 DIAGNOSIS — C4339 Malignant melanoma of other parts of face: Secondary | ICD-10-CM | POA: Diagnosis not present

## 2015-05-27 ENCOUNTER — Encounter: Payer: Medicare Other | Admitting: Family Medicine

## 2015-05-31 DIAGNOSIS — Z7982 Long term (current) use of aspirin: Secondary | ICD-10-CM | POA: Diagnosis not present

## 2015-05-31 DIAGNOSIS — I10 Essential (primary) hypertension: Secondary | ICD-10-CM | POA: Diagnosis not present

## 2015-05-31 DIAGNOSIS — Z79899 Other long term (current) drug therapy: Secondary | ICD-10-CM | POA: Diagnosis not present

## 2015-05-31 DIAGNOSIS — C430 Malignant melanoma of lip: Secondary | ICD-10-CM | POA: Diagnosis not present

## 2015-05-31 DIAGNOSIS — Z87891 Personal history of nicotine dependence: Secondary | ICD-10-CM | POA: Diagnosis not present

## 2015-06-04 DIAGNOSIS — C430 Malignant melanoma of lip: Secondary | ICD-10-CM | POA: Diagnosis not present

## 2015-06-26 ENCOUNTER — Other Ambulatory Visit: Payer: Self-pay | Admitting: Family Medicine

## 2015-06-26 DIAGNOSIS — C430 Malignant melanoma of lip: Secondary | ICD-10-CM | POA: Diagnosis not present

## 2015-07-01 ENCOUNTER — Encounter: Payer: Medicare Other | Admitting: Family Medicine

## 2015-07-01 DIAGNOSIS — D030 Melanoma in situ of lip: Secondary | ICD-10-CM | POA: Diagnosis not present

## 2015-07-01 DIAGNOSIS — H903 Sensorineural hearing loss, bilateral: Secondary | ICD-10-CM | POA: Diagnosis not present

## 2015-07-01 DIAGNOSIS — Z79899 Other long term (current) drug therapy: Secondary | ICD-10-CM | POA: Diagnosis not present

## 2015-07-01 DIAGNOSIS — I10 Essential (primary) hypertension: Secondary | ICD-10-CM | POA: Diagnosis not present

## 2015-07-01 DIAGNOSIS — C000 Malignant neoplasm of external upper lip: Secondary | ICD-10-CM | POA: Diagnosis not present

## 2015-07-01 DIAGNOSIS — C430 Malignant melanoma of lip: Secondary | ICD-10-CM | POA: Diagnosis not present

## 2015-07-01 DIAGNOSIS — Z7982 Long term (current) use of aspirin: Secondary | ICD-10-CM | POA: Diagnosis not present

## 2015-07-01 DIAGNOSIS — Z9049 Acquired absence of other specified parts of digestive tract: Secondary | ICD-10-CM | POA: Diagnosis not present

## 2015-07-01 DIAGNOSIS — Z87891 Personal history of nicotine dependence: Secondary | ICD-10-CM | POA: Diagnosis not present

## 2015-07-01 DIAGNOSIS — E785 Hyperlipidemia, unspecified: Secondary | ICD-10-CM | POA: Diagnosis not present

## 2015-07-02 ENCOUNTER — Telehealth: Payer: Self-pay | Admitting: Family Medicine

## 2015-07-02 NOTE — Telephone Encounter (Signed)
Pt need new Rx for amlodipine and lisinopril.   Pharm:  [facility]

## 2015-07-03 ENCOUNTER — Telehealth: Payer: Self-pay | Admitting: Family Medicine

## 2015-07-03 MED ORDER — LISINOPRIL 20 MG PO TABS: 20.0000 mg | ORAL_TABLET | Freq: Every day | ORAL | Status: DC

## 2015-07-03 MED ORDER — AMLODIPINE BESYLATE 5 MG PO TABS: 5.0000 mg | ORAL_TABLET | Freq: Every day | ORAL | Status: DC

## 2015-07-03 NOTE — Telephone Encounter (Signed)
Pt request refill of the following: lisinopril (PRINIVIL,ZESTRIL) 20 MG tablet ,  amLODipine (NORVASC) 5 MG tablet   Phamacy:   [facility]

## 2015-07-04 ENCOUNTER — Encounter: Payer: Medicare Other | Admitting: Adult Health

## 2015-07-04 NOTE — Telephone Encounter (Signed)
error 

## 2015-07-04 NOTE — Telephone Encounter (Signed)
Refill sent.

## 2015-07-09 DIAGNOSIS — H6121 Impacted cerumen, right ear: Secondary | ICD-10-CM | POA: Diagnosis not present

## 2015-07-09 DIAGNOSIS — H903 Sensorineural hearing loss, bilateral: Secondary | ICD-10-CM | POA: Diagnosis not present

## 2015-07-09 DIAGNOSIS — H900 Conductive hearing loss, bilateral: Secondary | ICD-10-CM | POA: Diagnosis not present

## 2015-07-09 DIAGNOSIS — H906 Mixed conductive and sensorineural hearing loss, bilateral: Secondary | ICD-10-CM | POA: Diagnosis not present

## 2015-07-09 DIAGNOSIS — I10 Essential (primary) hypertension: Secondary | ICD-10-CM | POA: Diagnosis not present

## 2015-07-09 DIAGNOSIS — Z79899 Other long term (current) drug therapy: Secondary | ICD-10-CM | POA: Diagnosis not present

## 2015-07-09 DIAGNOSIS — H832X3 Labyrinthine dysfunction, bilateral: Secondary | ICD-10-CM | POA: Diagnosis not present

## 2015-07-09 DIAGNOSIS — H9041 Sensorineural hearing loss, unilateral, right ear, with unrestricted hearing on the contralateral side: Secondary | ICD-10-CM | POA: Diagnosis not present

## 2015-07-09 DIAGNOSIS — Z7982 Long term (current) use of aspirin: Secondary | ICD-10-CM | POA: Diagnosis not present

## 2015-07-09 DIAGNOSIS — Z87891 Personal history of nicotine dependence: Secondary | ICD-10-CM | POA: Diagnosis not present

## 2015-07-18 ENCOUNTER — Other Ambulatory Visit: Payer: Self-pay | Admitting: Family Medicine

## 2015-07-23 ENCOUNTER — Ambulatory Visit (INDEPENDENT_AMBULATORY_CARE_PROVIDER_SITE_OTHER): Payer: Medicare Other | Admitting: Adult Health

## 2015-07-23 ENCOUNTER — Encounter: Payer: Self-pay | Admitting: Adult Health

## 2015-07-23 ENCOUNTER — Encounter: Payer: Medicare Other | Admitting: Adult Health

## 2015-07-23 VITALS — BP 160/82 | Temp 97.9°F | Wt 232.0 lb

## 2015-07-23 DIAGNOSIS — I10 Essential (primary) hypertension: Secondary | ICD-10-CM | POA: Diagnosis not present

## 2015-07-23 DIAGNOSIS — Z7189 Other specified counseling: Secondary | ICD-10-CM | POA: Diagnosis not present

## 2015-07-23 DIAGNOSIS — Z7689 Persons encountering health services in other specified circumstances: Secondary | ICD-10-CM

## 2015-07-23 MED ORDER — AMLODIPINE BESYLATE 10 MG PO TABS: 10.0000 mg | ORAL_TABLET | Freq: Every day | ORAL | Status: DC

## 2015-07-23 NOTE — Patient Instructions (Addendum)
It was great meeting you today!  Please follow up with me this summer for your physical.   Increase Amlodipine from 5 mg to 10 mg. Continue to monitor your blood pressure at home.   Increase physical activity and increase water intake.   Please let me know if you need anything.   Health Maintenance, Male A healthy lifestyle and preventative care can promote health and wellness.  Maintain regular health, dental, and eye exams.  Eat a healthy diet. Foods like vegetables, fruits, whole grains, low-fat dairy products, and lean protein foods contain the nutrients you need and are low in calories. Decrease your intake of foods high in solid fats, added sugars, and salt. Get information about a proper diet from your health care provider, if necessary.  Regular physical exercise is one of the most important things you can do for your health. Most adults should get at least 150 minutes of moderate-intensity exercise (any activity that increases your heart rate and causes you to sweat) each week. In addition, most adults need muscle-strengthening exercises on 2 or more days a week.   Maintain a healthy weight. The body mass index (BMI) is a screening tool to identify possible weight problems. It provides an estimate of body fat based on height and weight. Your health care provider can find your BMI and can help you achieve or maintain a healthy weight. For males 20 years and older:  A BMI below 18.5 is considered underweight.  A BMI of 18.5 to 24.9 is normal.  A BMI of 25 to 29.9 is considered overweight.  A BMI of 30 and above is considered obese.  Maintain normal blood lipids and cholesterol by exercising and minimizing your intake of saturated fat. Eat a balanced diet with plenty of fruits and vegetables. Blood tests for lipids and cholesterol should begin at age 95 and be repeated every 5 years. If your lipid or cholesterol levels are high, you are over age 33, or you are at high risk for  heart disease, you may need your cholesterol levels checked more frequently.Ongoing high lipid and cholesterol levels should be treated with medicines if diet and exercise are not working.  If you smoke, find out from your health care provider how to quit. If you do not use tobacco, do not start.  Lung cancer screening is recommended for adults aged 37-80 years who are at high risk for developing lung cancer because of a history of smoking. A yearly low-dose CT scan of the lungs is recommended for people who have at least a 30-pack-year history of smoking and are current smokers or have quit within the past 15 years. A pack year of smoking is smoking an average of 1 pack of cigarettes a day for 1 year (for example, a 30-pack-year history of smoking could mean smoking 1 pack a day for 30 years or 2 packs a day for 15 years). Yearly screening should continue until the smoker has stopped smoking for at least 15 years. Yearly screening should be stopped for people who develop a health problem that would prevent them from having lung cancer treatment.  If you choose to drink alcohol, do not have more than 2 drinks per day. One drink is considered to be 12 oz (360 mL) of beer, 5 oz (150 mL) of wine, or 1.5 oz (45 mL) of liquor.  Avoid the use of street drugs. Do not share needles with anyone. Ask for help if you need support or instructions about stopping  the use of drugs.  High blood pressure causes heart disease and increases the risk of stroke. High blood pressure is more likely to develop in:  People who have blood pressure in the end of the normal range (100-139/85-89 mm Hg).  People who are overweight or obese.  People who are African American.  If you are 83-47 years of age, have your blood pressure checked every 3-5 years. If you are 63 years of age or older, have your blood pressure checked every year. You should have your blood pressure measured twice--once when you are at a hospital or  clinic, and once when you are not at a hospital or clinic. Record the average of the two measurements. To check your blood pressure when you are not at a hospital or clinic, you can use:  An automated blood pressure machine at a pharmacy.  A home blood pressure monitor.  If you are 35-71 years old, ask your health care provider if you should take aspirin to prevent heart disease.  Diabetes screening involves taking a blood sample to check your fasting blood sugar level. This should be done once every 3 years after age 30 if you are at a normal weight and without risk factors for diabetes. Testing should be considered at a younger age or be carried out more frequently if you are overweight and have at least 1 risk factor for diabetes.  Colorectal cancer can be detected and often prevented. Most routine colorectal cancer screening begins at the age of 54 and continues through age 22. However, your health care provider may recommend screening at an earlier age if you have risk factors for colon cancer. On a yearly basis, your health care provider may provide home test kits to check for hidden blood in the stool. A small camera at the end of a tube may be used to directly examine the colon (sigmoidoscopy or colonoscopy) to detect the earliest forms of colorectal cancer. Talk to your health care provider about this at age 60 when routine screening begins. A direct exam of the colon should be repeated every 5-10 years through age 41, unless early forms of precancerous polyps or small growths are found.  People who are at an increased risk for hepatitis B should be screened for this virus. You are considered at high risk for hepatitis B if:  You were born in a country where hepatitis B occurs often. Talk with your health care provider about which countries are considered high risk.  Your parents were born in a high-risk country and you have not received a shot to protect against hepatitis B (hepatitis B  vaccine).  You have HIV or AIDS.  You use needles to inject street drugs.  You live with, or have sex with, someone who has hepatitis B.  You are a man who has sex with other men (MSM).  You get hemodialysis treatment.  You take certain medicines for conditions like cancer, organ transplantation, and autoimmune conditions.  Hepatitis C blood testing is recommended for all people born from [birth year] through 1963 and any individual with known risk factors for hepatitis C.  Healthy men should no longer receive prostate-specific antigen (PSA) blood tests as part of routine cancer screening. Talk to your health care provider about prostate cancer screening.  Testicular cancer screening is not recommended for adolescents or adult males who have no symptoms. Screening includes self-exam, a health care provider exam, and other screening tests. Consult with your health care provider about any  symptoms you have or any concerns you have about testicular cancer.  Practice safe sex. Use condoms and avoid high-risk sexual practices to reduce the spread of sexually transmitted infections (STIs).  You should be screened for STIs, including gonorrhea and chlamydia if:  You are sexually active and are younger than 24 years.  You are older than 24 years, and your health care provider tells you that you are at risk for this type of infection.  Your sexual activity has changed since you were last screened, and you are at an increased risk for chlamydia or gonorrhea. Ask your health care provider if you are at risk.  If you are at risk of being infected with HIV, it is recommended that you take a prescription medicine daily to prevent HIV infection. This is called pre-exposure prophylaxis (PrEP). You are considered at risk if:  You are a man who has sex with other men (MSM).  You are a heterosexual man who is sexually active with multiple partners.  You take drugs by injection.  You are sexually active  with a partner who has HIV.  Talk with your health care provider about whether you are at high risk of being infected with HIV. If you choose to begin PrEP, you should first be tested for HIV. You should then be tested every 3 months for as long as you are taking PrEP.  Use sunscreen. Apply sunscreen liberally and repeatedly throughout the day. You should seek shade when your shadow is shorter than you. Protect yourself by wearing long sleeves, pants, a wide-brimmed hat, and sunglasses year round whenever you are outdoors.  Tell your health care provider of new moles or changes in moles, especially if there is a change in shape or color. Also, tell your health care provider if a mole is larger than the size of a pencil eraser.  A one-time screening for abdominal aortic aneurysm (AAA) and surgical repair of large AAAs by ultrasound is recommended for men aged 45-75 years who are current or former smokers.  Stay current with your vaccines (immunizations).   This information is not intended to replace advice given to you by your health care provider. Make sure you discuss any questions you have with your health care provider.   Document Released: 08/22/2007 Document Revised: 03/16/2014 Document Reviewed: 07/21/2010 Elsevier Interactive Patient Education [employer].

## 2015-07-23 NOTE — Progress Notes (Signed)
Patient presents to clinic today to establish care. He is a pleasant 80 year old caucasian male who  has a past medical history of Hypertension; Hyperlipidemia; Hearing loss; and History of colonic polyps. His wife is present at this visit.   His last physical was in 05/2014 with MD Sherren Mocha.   He gets routine eye care, dental care, colonoscopy and GI, vaccinations are up-to-date.     Acute Concerns: Establish Care  Macular Degeneration  - Was diagnosed in January 2017.   Chronic Issues: Hypertension - Is taking 5 mg Amlodipine, 20 mg lisinopril, and 25 mg hydrochlorothiazide.   Hyperlipidemia - Feels as though this is well controlled on current medication. Denies any issues with statin  Health Maintenance: Dental -- Twice a year  Vision -- Yearly  Immunizations -- UTD Colonoscopy --2010 - No longer needs    Past Medical History  Diagnosis Date  . Hypertension   . Hyperlipidemia   . Hearing loss     rt ear  . History of colonic polyps     Past Surgical History  Procedure Laterality Date  . Cholecystectomy  11/2001    Current Outpatient Prescriptions on File Prior to Visit  Medication Sig Dispense Refill  . amLODipine (NORVASC) 5 MG tablet Take 1 tablet (5 mg total) by mouth daily. 100 tablet 0  . aspirin 81 MG tablet Take 81 mg by mouth daily.      . fish oil-omega-3 fatty acids 1000 MG capsule Take 2 g by mouth daily.      . hydrochlorothiazide (HYDRODIURIL) 25 MG tablet TAKE 1 TABLET DAILY 90 tablet 0  . lisinopril (PRINIVIL,ZESTRIL) 20 MG tablet Take 1 tablet (20 mg total) by mouth daily. 100 tablet 0  . Multiple Vitamin (MULTIVITAMIN) tablet Take 1 tablet by mouth daily.      . simvastatin (ZOCOR) 20 MG tablet TAKE 1 TABLET AT BEDTIME 90 tablet 3   No current facility-administered medications on file prior to visit.    No Known Allergies  Family History  Problem Relation Age of Onset  . Hyperlipidemia Mother   . Heart attack Father 72    Social  History   Social History  . Marital Status: Married    Spouse Name: N/A  . Number of Children: N/A  . Years of Education: N/A   Occupational History  . Not on file.   Social History Main Topics  . Smoking status: Former Research scientist (life sciences)  . Smokeless tobacco: Not on file  . Alcohol Use: Yes  . Drug Use: No  . Sexual Activity: Not on file   Other Topics Concern  . Not on file   Social History Narrative    Review of Systems  Constitutional: Negative.   Respiratory: Negative.   Gastrointestinal: Negative.   Genitourinary: Negative.   Neurological: Negative.   All other systems reviewed and are negative.   BP 160/82 mmHg  Temp(Src) 97.9 F (36.6 C) (Oral)  Wt 232 lb (105.235 kg)  Physical Exam  Constitutional: He is oriented to person, place, and time and well-developed, well-nourished, and in no distress. No distress.  Cardiovascular: Normal rate, regular rhythm, normal heart sounds and intact distal pulses.  Exam reveals no gallop and no friction rub.   No murmur heard. Pulmonary/Chest: Effort normal and breath sounds normal. No respiratory distress. He has no wheezes. He has no rales. He exhibits no tenderness.  Neurological: He is alert and oriented to person, place, and time. Gait normal. GCS score is 15.  Skin: Skin is warm and dry. No rash noted. He is not diaphoretic. No erythema. No pallor.  Scar on upper right lip from melanoma removal   Psychiatric: Mood, memory, affect and judgment normal.  Nursing note and vitals reviewed.   Assessment/Plan:  1. Encounter to establish care - Is due for annual exam  - follow up as needed - I would like him to exercise more and cut out carbs. He needs to lose weight.   2. Essential hypertension BP: (!) 160/82 mmHg - Increase Norvasc from 5 to 10 mg  - Keep lisinopril and HCTZ the same - Continue to monitor BP at home  - Will follow up with this at his physical.    Dorothyann Peng, NP

## 2015-07-26 DIAGNOSIS — C430 Malignant melanoma of lip: Secondary | ICD-10-CM | POA: Diagnosis not present

## 2015-10-23 ENCOUNTER — Encounter: Payer: Medicare Other | Admitting: Adult Health

## 2015-10-23 ENCOUNTER — Other Ambulatory Visit: Payer: Self-pay | Admitting: Family Medicine

## 2015-10-23 NOTE — Telephone Encounter (Signed)
Rx refill sent to pharmacy. 

## 2015-10-24 ENCOUNTER — Other Ambulatory Visit (INDEPENDENT_AMBULATORY_CARE_PROVIDER_SITE_OTHER): Payer: Medicare Other

## 2015-10-24 ENCOUNTER — Encounter: Payer: Self-pay | Admitting: Adult Health

## 2015-10-24 ENCOUNTER — Ambulatory Visit (INDEPENDENT_AMBULATORY_CARE_PROVIDER_SITE_OTHER): Payer: Medicare Other | Admitting: Adult Health

## 2015-10-24 VITALS — BP 160/82 | Temp 97.6°F | Ht 71.5 in | Wt 233.2 lb

## 2015-10-24 DIAGNOSIS — E785 Hyperlipidemia, unspecified: Secondary | ICD-10-CM

## 2015-10-24 DIAGNOSIS — L4050 Arthropathic psoriasis, unspecified: Secondary | ICD-10-CM | POA: Diagnosis not present

## 2015-10-24 DIAGNOSIS — R972 Elevated prostate specific antigen [PSA]: Secondary | ICD-10-CM | POA: Diagnosis not present

## 2015-10-24 DIAGNOSIS — I10 Essential (primary) hypertension: Secondary | ICD-10-CM | POA: Diagnosis not present

## 2015-10-24 LAB — POC URINALSYSI DIPSTICK (AUTOMATED)
Bilirubin, UA: NEGATIVE
Blood, UA: NEGATIVE
GLUCOSE UA: NEGATIVE
Ketones, UA: NEGATIVE
LEUKOCYTES UA: NEGATIVE
Nitrite, UA: NEGATIVE
SPEC GRAV UA: 1.02
UROBILINOGEN UA: 0.2
pH, UA: 6.5

## 2015-10-24 LAB — BASIC METABOLIC PANEL
BUN: 19 mg/dL (ref 6–23)
CALCIUM: 9 mg/dL (ref 8.4–10.5)
CO2: 32 mEq/L (ref 19–32)
CREATININE: 0.93 mg/dL (ref 0.40–1.50)
Chloride: 102 mEq/L (ref 96–112)
GFR: 83.56 mL/min (ref >60.00)
Glucose, Bld: 99 mg/dL (ref 70–99)
Potassium: 3.8 mEq/L (ref 3.5–5.1)
SODIUM: 142 meq/L (ref 135–145)

## 2015-10-24 LAB — LIPID PANEL
CHOL/HDL RATIO: 3
Cholesterol: 164 mg/dL (ref 0–200)
HDL: 51.1 mg/dL (ref >39.00)
LDL Cholesterol: 91 mg/dL (ref 0–99)
NONHDL: 113.24
Triglycerides: 111 mg/dL (ref 0.0–149.0)
VLDL: 22.2 mg/dL (ref 0.0–40.0)

## 2015-10-24 LAB — CBC WITH DIFFERENTIAL/PLATELET
BASOS ABS: 0.1 10*3/uL (ref 0.0–0.1)
Basophils Relative: 0.6 % (ref 0.0–3.0)
EOS ABS: 0.5 10*3/uL (ref 0.0–0.7)
Eosinophils Relative: 5.9 % — ABNORMAL HIGH (ref 0.0–5.0)
HEMATOCRIT: 44 % (ref 39.0–52.0)
HEMOGLOBIN: 14.7 g/dL (ref 13.0–17.0)
LYMPHS PCT: 19.6 % (ref 12.0–46.0)
Lymphs Abs: 1.7 10*3/uL (ref 0.7–4.0)
MCHC: 33.4 g/dL (ref 30.0–36.0)
MCV: 83 fl (ref 78.0–100.0)
Monocytes Absolute: 0.7 10*3/uL (ref 0.1–1.0)
Monocytes Relative: 8.1 % (ref 3.0–12.0)
Neutro Abs: 5.7 10*3/uL (ref 1.4–7.7)
Neutrophils Relative %: 65.8 % (ref 43.0–77.0)
PLATELETS: 229 10*3/uL (ref 150.0–400.0)
RBC: 5.3 Mil/uL (ref 4.22–5.81)
RDW: 15.1 % (ref 11.5–15.5)
WBC: 8.6 10*3/uL (ref 4.0–10.5)

## 2015-10-24 LAB — HEPATIC FUNCTION PANEL
ALK PHOS: 79 U/L (ref 39–117)
ALT: 24 U/L (ref 0–53)
AST: 18 U/L (ref 0–37)
Albumin: 4 g/dL (ref 3.5–5.2)
BILIRUBIN DIRECT: 0.1 mg/dL (ref 0.0–0.3)
TOTAL PROTEIN: 7 g/dL (ref 6.0–8.3)
Total Bilirubin: 0.5 mg/dL (ref 0.2–1.2)

## 2015-10-24 LAB — PSA: PSA: 0.59 ng/mL (ref 0.10–4.00)

## 2015-10-24 LAB — TSH: TSH: 2.36 u[IU]/mL (ref 0.35–4.50)

## 2015-10-24 NOTE — Progress Notes (Signed)
Subjective:    Patient ID: Derek Baker, male    DOB: [birth year], 80 y.o.   MRN: [id number]  HPI  80 year old male who presents to the office today for yearly follow up due to history of hypertension and hyperlipidemia.   He takes amlodipine 10 mg, HCTZ 25 mg, and lisinopril  for hypertension   Simvastatin 20mg  for hyperlipidemia.   He gets routine eye care, dental care, colonoscopy and GI, vaccinations are up-to-date  Cognitive function normal he walks on a regular basis home health safety reviewed no issues identified, no guns in the house, he does have a healthcare power of attorney and living well  His blood pressure has been elevated - per his log. Appears to be in the 140-160's in the morning before medication and then in the 140-150's before bed. Denies headaches or blurred vision. He feels as though this is from his recent weight gain   Review of Systems  Constitutional: Negative.   HENT: Negative.   Eyes: Negative.   Respiratory: Negative.   Cardiovascular: Negative.   Gastrointestinal: Negative.   Endocrine: Negative.   Genitourinary: Negative.   Musculoskeletal: Negative.   Skin: Negative.   Allergic/Immunologic: Negative.   Neurological: Negative.   Hematological: Negative.   Psychiatric/Behavioral: Negative.    Past Medical History:  Diagnosis Date  . Aneurysm ([place])   . BPH (benign prostatic hyperplasia)    upper lip   . Hearing loss    rt ear. 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 History of colonic polyps   . Hyperlipidemia   . Hypertension   . Kidney stone   . Macular degeneration 03/2015   . Melanoma ([place])   . Squamous cell skin cancer, face     Social History   Social History  . Marital status: Married    Spouse name: N/A  . Number of children: N/A  . Years of education: N/A   Occupational History  . Not on file.   Social History Main Topics  . Smoking status: Former Research scientist (life sciences)  . Smokeless tobacco: Not on file  . Alcohol use No   Comment: socially   . Drug use: No  . Sexual activity: Not on file   Other Topics Concern  . Not on file   Social History Narrative   Retired    Married    Two sons ( one in [place] and one in [place]).     Past Surgical History:  Procedure Laterality Date  . APPENDECTOMY    . CHOLECYSTECTOMY  11/2001  . LITHOTRIPSY    . Mose Surgery      Family History  Problem Relation Age of Onset  . Hyperlipidemia Mother   . Heart attack Father 2  . Hypertension Mother     No Known Allergies  Current Outpatient Prescriptions on File Prior to Visit  Medication Sig Dispense Refill  . amLODipine (NORVASC) 10 MG tablet Take 1 tablet (10 mg total) by mouth daily. 90 tablet 1  . aspirin 81 MG tablet Take 81 mg by mouth daily.      . fish oil-omega-3 fatty acids 1000 MG capsule Take 2 g by mouth daily.      . hydrochlorothiazide (HYDRODIURIL) 25 MG tablet TAKE 1 TABLET DAILY. MAKE  AN APPOINTMENT FOR FURTHER REFILLS 90 tablet 0  . lisinopril (PRINIVIL,ZESTRIL) 20 MG tablet Take 1 tablet (20 mg total) by mouth daily. 100 tablet 0  . Multiple Vitamin (MULTIVITAMIN) tablet Take 1 tablet by  mouth daily.      . simvastatin (ZOCOR) 20 MG tablet TAKE 1 TABLET AT BEDTIME 90 tablet 3   No current facility-administered medications on file prior to visit.     BP (!) 160/82   Temp 97.6 F (36.4 C) (Oral)   Ht 5' 11.5" (1.816 m)   Wt 233 lb 3.2 oz (105.8 kg)   BMI 32.07 kg/m       Objective:   Physical Exam  Constitutional: He is oriented to person, place, and time. He appears well-developed and well-nourished.  obese  HENT:  Head: Normocephalic and atraumatic.  Right Ear: External ear normal.  Left Ear: External ear normal.  Nose: Nose normal.  Mouth/Throat: Oropharynx is clear and moist. No oropharyngeal exudate.  Eyes: Conjunctivae and EOM are normal. Pupils are equal, round, and reactive to light. Right eye exhibits no discharge. Left eye exhibits no discharge. No scleral icterus.   Neck: Normal range of motion. Neck supple. No JVD present. No tracheal deviation present. No thyromegaly present.  Cardiovascular: Normal rate, regular rhythm, normal heart sounds and intact distal pulses.  Exam reveals no gallop and no friction rub.   No murmur heard. Pulmonary/Chest: Effort normal and breath sounds normal. No stridor. No respiratory distress. He has no wheezes. He has no rales. He exhibits no tenderness.  Abdominal: Soft. Bowel sounds are normal. He exhibits no distension and no mass. There is no tenderness. There is no rebound and no guarding.  Genitourinary:  Genitourinary Comments: Deferred prostate exam   Musculoskeletal: Normal range of motion. He exhibits no edema, tenderness or deformity.  Lymphadenopathy:    He has no cervical adenopathy.  Neurological: He is alert and oriented to person, place, and time. He has normal reflexes. He displays normal reflexes. No cranial nerve deficit. He exhibits normal muscle tone. Coordination normal.  Skin: Skin is warm and dry. No rash noted. No erythema. No pallor.  Psychiatric: He has a normal mood and affect. His behavior is normal. Judgment and thought content normal.  Nursing note and vitals reviewed.      Assessment & Plan:  1. Hyperlipidemia  - EKG 12-Lead- SR with nonspecific QRS widening. Rate 65. No change from previous EKG's - POCT Urinalysis Dipstick (Automated) - Basic metabolic panel - CBC with Differential/Platelet - Hepatic function panel - Lipid panel - TSH - Advised weight loss and exercise  2. Essential hypertension - BP up per log.  - 20 mg Lisinopril BID - Send me BP in one week - POCT Urinalysis Dipstick (Automated) - Basic metabolic panel - CBC with Differential/Platelet - Hepatic function panel - Lipid panel - TSH - Heart healthy diet and exercise to lose weight - Follow up in one year or sooner if needed  Dorothyann Peng, NP

## 2015-10-24 NOTE — Patient Instructions (Addendum)
It was great seeing you today   I will follow up with you on your blood work   Start taking 20 mg of lisinopril in the morning and at night. Let me know what your blood pressures have been in a week   Work on diet and exercise  Follow up in one year   Health Maintenance, Male A healthy lifestyle and preventative care can promote health and wellness.  Maintain regular health, dental, and eye exams.  Eat a healthy diet. Foods like vegetables, fruits, whole grains, low-fat dairy products, and lean protein foods contain the nutrients you need and are low in calories. Decrease your intake of foods high in solid fats, added sugars, and salt. Get information about a proper diet from your health care provider, if necessary.  Regular physical exercise is one of the most important things you can do for your health. Most adults should get at least 150 minutes of moderate-intensity exercise (any activity that increases your heart rate and causes you to sweat) each week. In addition, most adults need muscle-strengthening exercises on 2 or more days a week.   Maintain a healthy weight. The body mass index (BMI) is a screening tool to identify possible weight problems. It provides an estimate of body fat based on height and weight. Your health care provider can find your BMI and can help you achieve or maintain a healthy weight. For males 20 years and older:  A BMI below 18.5 is considered underweight.  A BMI of 18.5 to 24.9 is normal.  A BMI of 25 to 29.9 is considered overweight.  A BMI of 30 and above is considered obese.  Maintain normal blood lipids and cholesterol by exercising and minimizing your intake of saturated fat. Eat a balanced diet with plenty of fruits and vegetables. Blood tests for lipids and cholesterol should begin at age 35 and be repeated every 5 years. If your lipid or cholesterol levels are high, you are over age 65, or you are at high risk for heart disease, you may need your  cholesterol levels checked more frequently.Ongoing high lipid and cholesterol levels should be treated with medicines if diet and exercise are not working.  If you smoke, find out from your health care provider how to quit. If you do not use tobacco, do not start.  Lung cancer screening is recommended for adults aged 33-80 years who are at high risk for developing lung cancer because of a history of smoking. A yearly low-dose CT scan of the lungs is recommended for people who have at least a 30-pack-year history of smoking and are current smokers or have quit within the past 15 years. A pack year of smoking is smoking an average of 1 pack of cigarettes a day for 1 year (for example, a 30-pack-year history of smoking could mean smoking 1 pack a day for 30 years or 2 packs a day for 15 years). Yearly screening should continue until the smoker has stopped smoking for at least 15 years. Yearly screening should be stopped for people who develop a health problem that would prevent them from having lung cancer treatment.  If you choose to drink alcohol, do not have more than 2 drinks per day. One drink is considered to be 12 oz (360 mL) of beer, 5 oz (150 mL) of wine, or 1.5 oz (45 mL) of liquor.  Avoid the use of street drugs. Do not share needles with anyone. Ask for help if you need support or  instructions about stopping the use of drugs.  High blood pressure causes heart disease and increases the risk of stroke. High blood pressure is more likely to develop in:  People who have blood pressure in the end of the normal range (100-139/85-89 mm Hg).  People who are overweight or obese.  People who are African American.  If you are 43-63 years of age, have your blood pressure checked every 3-5 years. If you are 1 years of age or older, have your blood pressure checked every year. You should have your blood pressure measured twice--once when you are at a hospital or clinic, and once when you are not at a  hospital or clinic. Record the average of the two measurements. To check your blood pressure when you are not at a hospital or clinic, you can use:  An automated blood pressure machine at a pharmacy.  A home blood pressure monitor.  If you are 40-35 years old, ask your health care provider if you should take aspirin to prevent heart disease.  Diabetes screening involves taking a blood sample to check your fasting blood sugar level. This should be done once every 3 years after age 67 if you are at a normal weight and without risk factors for diabetes. Testing should be considered at a younger age or be carried out more frequently if you are overweight and have at least 1 risk factor for diabetes.  Colorectal cancer can be detected and often prevented. Most routine colorectal cancer screening begins at the age of 66 and continues through age 68. However, your health care provider may recommend screening at an earlier age if you have risk factors for colon cancer. On a yearly basis, your health care provider may provide home test kits to check for hidden blood in the stool. A small camera at the end of a tube may be used to directly examine the colon (sigmoidoscopy or colonoscopy) to detect the earliest forms of colorectal cancer. Talk to your health care provider about this at age 32 when routine screening begins. A direct exam of the colon should be repeated every 5-10 years through age 46, unless early forms of precancerous polyps or small growths are found.  People who are at an increased risk for hepatitis B should be screened for this virus. You are considered at high risk for hepatitis B if:  You were born in a country where hepatitis B occurs often. Talk with your health care provider about which countries are considered high risk.  Your parents were born in a high-risk country and you have not received a shot to protect against hepatitis B (hepatitis B vaccine).  You have HIV or AIDS.  You  use needles to inject street drugs.  You live with, or have sex with, someone who has hepatitis B.  You are a man who has sex with other men (MSM).  You get hemodialysis treatment.  You take certain medicines for conditions like cancer, organ transplantation, and autoimmune conditions.  Hepatitis C blood testing is recommended for all people born from [birth year] through 1963 and any individual with known risk factors for hepatitis C.  Healthy men should no longer receive prostate-specific antigen (PSA) blood tests as part of routine cancer screening. Talk to your health care provider about prostate cancer screening.  Testicular cancer screening is not recommended for adolescents or adult males who have no symptoms. Screening includes self-exam, a health care provider exam, and other screening tests. Consult with your health care  provider about any symptoms you have or any concerns you have about testicular cancer.  Practice safe sex. Use condoms and avoid high-risk sexual practices to reduce the spread of sexually transmitted infections (STIs).  You should be screened for STIs, including gonorrhea and chlamydia if:  You are sexually active and are younger than 24 years.  You are older than 24 years, and your health care provider tells you that you are at risk for this type of infection.  Your sexual activity has changed since you were last screened, and you are at an increased risk for chlamydia or gonorrhea. Ask your health care provider if you are at risk.  If you are at risk of being infected with HIV, it is recommended that you take a prescription medicine daily to prevent HIV infection. This is called pre-exposure prophylaxis (PrEP). You are considered at risk if:  You are a man who has sex with other men (MSM).  You are a heterosexual man who is sexually active with multiple partners.  You take drugs by injection.  You are sexually active with a partner who has HIV.  Talk with  your health care provider about whether you are at high risk of being infected with HIV. If you choose to begin PrEP, you should first be tested for HIV. You should then be tested every 3 months for as long as you are taking PrEP.  Use sunscreen. Apply sunscreen liberally and repeatedly throughout the day. You should seek shade when your shadow is shorter than you. Protect yourself by wearing long sleeves, pants, a wide-brimmed hat, and sunglasses year round whenever you are outdoors.  Tell your health care provider of new moles or changes in moles, especially if there is a change in shape or color. Also, tell your health care provider if a mole is larger than the size of a pencil eraser.  A one-time screening for abdominal aortic aneurysm (AAA) and surgical repair of large AAAs by ultrasound is recommended for men aged 39-75 years who are current or former smokers.  Stay current with your vaccines (immunizations).   This information is not intended to replace advice given to you by your health care provider. Make sure you discuss any questions you have with your health care provider.   Document Released: 08/22/2007 Document Revised: 03/16/2014 Document Reviewed: 07/21/2010 Elsevier Interactive Patient Education [employer].

## 2015-10-28 DIAGNOSIS — L821 Other seborrheic keratosis: Secondary | ICD-10-CM | POA: Diagnosis not present

## 2015-10-28 DIAGNOSIS — L570 Actinic keratosis: Secondary | ICD-10-CM | POA: Diagnosis not present

## 2015-10-28 DIAGNOSIS — Z85820 Personal history of malignant melanoma of skin: Secondary | ICD-10-CM | POA: Diagnosis not present

## 2015-10-28 DIAGNOSIS — D1801 Hemangioma of skin and subcutaneous tissue: Secondary | ICD-10-CM | POA: Diagnosis not present

## 2015-10-28 DIAGNOSIS — D485 Neoplasm of uncertain behavior of skin: Secondary | ICD-10-CM | POA: Diagnosis not present

## 2015-10-28 DIAGNOSIS — Z85828 Personal history of other malignant neoplasm of skin: Secondary | ICD-10-CM | POA: Diagnosis not present

## 2015-10-29 ENCOUNTER — Encounter: Payer: Self-pay | Admitting: Adult Health

## 2015-11-01 ENCOUNTER — Other Ambulatory Visit: Payer: Self-pay

## 2015-11-12 ENCOUNTER — Encounter: Payer: Self-pay | Admitting: Adult Health

## 2015-11-14 ENCOUNTER — Other Ambulatory Visit: Payer: Self-pay | Admitting: Adult Health

## 2015-11-14 ENCOUNTER — Other Ambulatory Visit: Payer: Self-pay | Admitting: Family Medicine

## 2015-11-14 ENCOUNTER — Encounter: Payer: Self-pay | Admitting: Adult Health

## 2015-11-14 MED ORDER — LISINOPRIL 40 MG PO TABS: 20.0000 mg | ORAL_TABLET | Freq: Every day | ORAL | 1 refills | Status: DC

## 2016-01-02 ENCOUNTER — Encounter: Payer: Self-pay | Admitting: Adult Health

## 2016-01-02 ENCOUNTER — Other Ambulatory Visit: Payer: Self-pay | Admitting: Adult Health

## 2016-01-08 ENCOUNTER — Other Ambulatory Visit: Payer: Self-pay

## 2016-01-13 ENCOUNTER — Telehealth: Payer: Self-pay | Admitting: *Deleted

## 2016-01-13 MED ORDER — LISINOPRIL 40 MG PO TABS: 20.0000 mg | ORAL_TABLET | Freq: Two times a day (BID) | ORAL | 1 refills | Status: DC

## 2016-01-13 NOTE — Telephone Encounter (Signed)
Spoke to pt, asked him how I could help him? The Rx for Lisinopril was sent to mail order on 9/7. Pt said yes but the Rx has been increased since and I only received a 45 day supply. I am taking half a tablet twice a day. Told pt okay I will send new Rx to reflect change so you will get a 90 day supply. Pt verbalized understanding. Rx sent to [facility] mail order.

## 2016-01-16 DIAGNOSIS — Z23 Encounter for immunization: Secondary | ICD-10-CM | POA: Diagnosis not present

## 2016-01-21 ENCOUNTER — Other Ambulatory Visit: Payer: Self-pay | Admitting: Family Medicine

## 2016-01-21 DIAGNOSIS — H6121 Impacted cerumen, right ear: Secondary | ICD-10-CM | POA: Diagnosis not present

## 2016-01-21 DIAGNOSIS — I10 Essential (primary) hypertension: Secondary | ICD-10-CM | POA: Diagnosis not present

## 2016-01-21 DIAGNOSIS — Z974 Presence of external hearing-aid: Secondary | ICD-10-CM | POA: Diagnosis not present

## 2016-01-21 DIAGNOSIS — C430 Malignant melanoma of lip: Secondary | ICD-10-CM | POA: Diagnosis not present

## 2016-01-21 DIAGNOSIS — Z7982 Long term (current) use of aspirin: Secondary | ICD-10-CM | POA: Diagnosis not present

## 2016-01-21 DIAGNOSIS — Z87891 Personal history of nicotine dependence: Secondary | ICD-10-CM | POA: Diagnosis not present

## 2016-01-21 DIAGNOSIS — H832X3 Labyrinthine dysfunction, bilateral: Secondary | ICD-10-CM | POA: Diagnosis not present

## 2016-01-21 DIAGNOSIS — H832X9 Labyrinthine dysfunction, unspecified ear: Secondary | ICD-10-CM | POA: Diagnosis not present

## 2016-01-21 DIAGNOSIS — H903 Sensorineural hearing loss, bilateral: Secondary | ICD-10-CM | POA: Diagnosis not present

## 2016-03-16 DIAGNOSIS — D485 Neoplasm of uncertain behavior of skin: Secondary | ICD-10-CM | POA: Diagnosis not present

## 2016-03-16 DIAGNOSIS — Z85820 Personal history of malignant melanoma of skin: Secondary | ICD-10-CM | POA: Diagnosis not present

## 2016-03-16 DIAGNOSIS — Z85828 Personal history of other malignant neoplasm of skin: Secondary | ICD-10-CM | POA: Diagnosis not present

## 2016-03-16 DIAGNOSIS — L905 Scar conditions and fibrosis of skin: Secondary | ICD-10-CM | POA: Diagnosis not present

## 2016-04-08 DIAGNOSIS — H04123 Dry eye syndrome of bilateral lacrimal glands: Secondary | ICD-10-CM | POA: Diagnosis not present

## 2016-04-08 DIAGNOSIS — H353221 Exudative age-related macular degeneration, left eye, with active choroidal neovascularization: Secondary | ICD-10-CM | POA: Diagnosis not present

## 2016-04-08 DIAGNOSIS — H2513 Age-related nuclear cataract, bilateral: Secondary | ICD-10-CM | POA: Diagnosis not present

## 2016-04-08 DIAGNOSIS — H353111 Nonexudative age-related macular degeneration, right eye, early dry stage: Secondary | ICD-10-CM | POA: Diagnosis not present

## 2016-04-16 DIAGNOSIS — H353221 Exudative age-related macular degeneration, left eye, with active choroidal neovascularization: Secondary | ICD-10-CM | POA: Diagnosis not present

## 2016-04-20 ENCOUNTER — Other Ambulatory Visit: Payer: Self-pay | Admitting: Family Medicine

## 2016-05-08 DIAGNOSIS — D225 Melanocytic nevi of trunk: Secondary | ICD-10-CM | POA: Diagnosis not present

## 2016-05-08 DIAGNOSIS — L821 Other seborrheic keratosis: Secondary | ICD-10-CM | POA: Diagnosis not present

## 2016-05-08 DIAGNOSIS — Z85828 Personal history of other malignant neoplasm of skin: Secondary | ICD-10-CM | POA: Diagnosis not present

## 2016-05-08 DIAGNOSIS — L570 Actinic keratosis: Secondary | ICD-10-CM | POA: Diagnosis not present

## 2016-05-08 DIAGNOSIS — L812 Freckles: Secondary | ICD-10-CM | POA: Diagnosis not present

## 2016-05-08 DIAGNOSIS — Z85820 Personal history of malignant melanoma of skin: Secondary | ICD-10-CM | POA: Diagnosis not present

## 2016-05-20 DIAGNOSIS — H353221 Exudative age-related macular degeneration, left eye, with active choroidal neovascularization: Secondary | ICD-10-CM | POA: Diagnosis not present

## 2016-05-24 ENCOUNTER — Other Ambulatory Visit: Payer: Self-pay | Admitting: Family Medicine

## 2016-06-25 DIAGNOSIS — H353221 Exudative age-related macular degeneration, left eye, with active choroidal neovascularization: Secondary | ICD-10-CM | POA: Diagnosis not present

## 2016-06-30 ENCOUNTER — Other Ambulatory Visit: Payer: Self-pay | Admitting: Adult Health

## 2016-06-30 NOTE — Telephone Encounter (Signed)
Ok to refill for one year  

## 2016-07-22 DIAGNOSIS — H353222 Exudative age-related macular degeneration, left eye, with inactive choroidal neovascularization: Secondary | ICD-10-CM | POA: Diagnosis not present

## 2016-07-27 DIAGNOSIS — Z974 Presence of external hearing-aid: Secondary | ICD-10-CM | POA: Diagnosis not present

## 2016-07-27 DIAGNOSIS — H6121 Impacted cerumen, right ear: Secondary | ICD-10-CM | POA: Diagnosis not present

## 2016-07-27 DIAGNOSIS — H903 Sensorineural hearing loss, bilateral: Secondary | ICD-10-CM | POA: Diagnosis not present

## 2016-07-27 DIAGNOSIS — Z7982 Long term (current) use of aspirin: Secondary | ICD-10-CM | POA: Diagnosis not present

## 2016-07-27 DIAGNOSIS — Z87891 Personal history of nicotine dependence: Secondary | ICD-10-CM | POA: Diagnosis not present

## 2016-07-27 DIAGNOSIS — I10 Essential (primary) hypertension: Secondary | ICD-10-CM | POA: Diagnosis not present

## 2016-07-27 DIAGNOSIS — Z79899 Other long term (current) drug therapy: Secondary | ICD-10-CM | POA: Diagnosis not present

## 2016-07-27 DIAGNOSIS — H832X3 Labyrinthine dysfunction, bilateral: Secondary | ICD-10-CM | POA: Diagnosis not present

## 2016-07-27 DIAGNOSIS — H9041 Sensorineural hearing loss, unilateral, right ear, with unrestricted hearing on the contralateral side: Secondary | ICD-10-CM | POA: Diagnosis not present

## 2016-07-27 DIAGNOSIS — H832X9 Labyrinthine dysfunction, unspecified ear: Secondary | ICD-10-CM | POA: Diagnosis not present

## 2016-08-19 ENCOUNTER — Other Ambulatory Visit: Payer: Self-pay | Admitting: Adult Health

## 2016-10-14 DIAGNOSIS — M5431 Sciatica, right side: Secondary | ICD-10-CM | POA: Diagnosis not present

## 2016-10-14 DIAGNOSIS — M9903 Segmental and somatic dysfunction of lumbar region: Secondary | ICD-10-CM | POA: Diagnosis not present

## 2016-10-23 DIAGNOSIS — H353222 Exudative age-related macular degeneration, left eye, with inactive choroidal neovascularization: Secondary | ICD-10-CM | POA: Diagnosis not present

## 2016-10-27 ENCOUNTER — Encounter: Payer: Medicare Other | Admitting: Adult Health

## 2016-10-30 ENCOUNTER — Ambulatory Visit (INDEPENDENT_AMBULATORY_CARE_PROVIDER_SITE_OTHER): Payer: Medicare Other | Admitting: Adult Health

## 2016-10-30 ENCOUNTER — Encounter: Payer: Self-pay | Admitting: Adult Health

## 2016-10-30 VITALS — BP 146/86 | Temp 97.9°F | Ht 71.0 in | Wt 233.0 lb

## 2016-10-30 DIAGNOSIS — I10 Essential (primary) hypertension: Secondary | ICD-10-CM

## 2016-10-30 DIAGNOSIS — N401 Enlarged prostate with lower urinary tract symptoms: Secondary | ICD-10-CM | POA: Diagnosis not present

## 2016-10-30 DIAGNOSIS — E782 Mixed hyperlipidemia: Secondary | ICD-10-CM | POA: Diagnosis not present

## 2016-10-30 DIAGNOSIS — D0461 Carcinoma in situ of skin of right upper limb, including shoulder: Secondary | ICD-10-CM | POA: Diagnosis not present

## 2016-10-30 DIAGNOSIS — L723 Sebaceous cyst: Secondary | ICD-10-CM | POA: Diagnosis not present

## 2016-10-30 DIAGNOSIS — D485 Neoplasm of uncertain behavior of skin: Secondary | ICD-10-CM | POA: Diagnosis not present

## 2016-10-30 DIAGNOSIS — Z85828 Personal history of other malignant neoplasm of skin: Secondary | ICD-10-CM | POA: Diagnosis not present

## 2016-10-30 DIAGNOSIS — Z85820 Personal history of malignant melanoma of skin: Secondary | ICD-10-CM | POA: Diagnosis not present

## 2016-10-30 DIAGNOSIS — L821 Other seborrheic keratosis: Secondary | ICD-10-CM | POA: Diagnosis not present

## 2016-10-30 DIAGNOSIS — R351 Nocturia: Secondary | ICD-10-CM

## 2016-10-30 DIAGNOSIS — L570 Actinic keratosis: Secondary | ICD-10-CM | POA: Diagnosis not present

## 2016-10-30 LAB — CBC WITH DIFFERENTIAL/PLATELET
Basophils Absolute: 0.1 10*3/uL (ref 0.0–0.1)
Basophils Relative: 0.8 % (ref 0.0–3.0)
Eosinophils Absolute: 0.5 10*3/uL (ref 0.0–0.7)
Eosinophils Relative: 6.5 % — ABNORMAL HIGH (ref 0.0–5.0)
HCT: 43.9 % (ref 39.0–52.0)
HEMOGLOBIN: 14.5 g/dL (ref 13.0–17.0)
LYMPHS ABS: 1.9 10*3/uL (ref 0.7–4.0)
Lymphocytes Relative: 22.5 % (ref 12.0–46.0)
MCHC: 32.9 g/dL (ref 30.0–36.0)
MCV: 84.3 fl (ref 78.0–100.0)
MONO ABS: 0.7 10*3/uL (ref 0.1–1.0)
Monocytes Relative: 9 % (ref 3.0–12.0)
NEUTROS PCT: 61.2 % (ref 43.0–77.0)
Neutro Abs: 5.1 10*3/uL (ref 1.4–7.7)
Platelets: 236 10*3/uL (ref 150.0–400.0)
RBC: 5.21 Mil/uL (ref 4.22–5.81)
RDW: 15.2 % (ref 11.5–15.5)
WBC: 8.3 10*3/uL (ref 4.0–10.5)

## 2016-10-30 LAB — LIPID PANEL
CHOLESTEROL: 161 mg/dL (ref 0–200)
HDL: 50.5 mg/dL (ref >39.00)
LDL Cholesterol: 95 mg/dL (ref 0–99)
NonHDL: 110.86
Total CHOL/HDL Ratio: 3
Triglycerides: 80 mg/dL (ref 0.0–149.0)
VLDL: 16 mg/dL (ref 0.0–40.0)

## 2016-10-30 LAB — BASIC METABOLIC PANEL
BUN: 19 mg/dL (ref 6–23)
CO2: 34 mEq/L — ABNORMAL HIGH (ref 19–32)
Calcium: 9.1 mg/dL (ref 8.4–10.5)
Chloride: 102 mEq/L (ref 96–112)
Creatinine, Ser: 0.94 mg/dL (ref 0.40–1.50)
GFR: 82.32 mL/min (ref >60.00)
Glucose, Bld: 113 mg/dL — ABNORMAL HIGH (ref 70–99)
Potassium: 3.9 mEq/L (ref 3.5–5.1)
Sodium: 141 mEq/L (ref 135–145)

## 2016-10-30 LAB — HEPATIC FUNCTION PANEL
ALT: 19 U/L (ref 0–53)
AST: 18 U/L (ref 0–37)
Albumin: 4 g/dL (ref 3.5–5.2)
Alkaline Phosphatase: 77 U/L (ref 39–117)
BILIRUBIN DIRECT: 0.2 mg/dL (ref 0.0–0.3)
BILIRUBIN TOTAL: 0.7 mg/dL (ref 0.2–1.2)
TOTAL PROTEIN: 6.8 g/dL (ref 6.0–8.3)

## 2016-10-30 LAB — PSA: PSA: 0.6 ng/mL (ref 0.10–4.00)

## 2016-10-30 LAB — TSH: TSH: 1.31 u[IU]/mL (ref 0.35–4.50)

## 2016-10-30 MED ORDER — LISINOPRIL 40 MG PO TABS: 40.0000 mg | ORAL_TABLET | Freq: Every day | ORAL | 3 refills | Status: AC

## 2016-10-30 NOTE — Patient Instructions (Signed)
It was great seeing you today   I am going to increase your lisinopril from 20 mg to 40 mg daily.  Stop Amlodipine 10 mg to see if this is contributing to the fatigue.   Make sure you stay active and try and cut back on naps in the afternoon    I will follow up with you when I get your blood work back

## 2016-10-30 NOTE — Progress Notes (Signed)
Subjective:    Patient ID: DEVERE BREM, male    DOB: 03/22/1935, 81 y.o.   MRN: [id number]  HPI  Patient presents for yearly follow up exam. He has a history of  has a past medical history of Aneurysm ([place]); BPH (benign prostatic hyperplasia); Hearing loss; History of colonic polyps; Hyperlipidemia; Hypertension; Kidney stone; Macular degeneration (03/2015 ); Melanoma ([place]); and Squamous cell skin cancer, face.  All immunizations and health maintenance protocols were reviewed with the patient and needed orders were placed.  Appropriate screening laboratory values were ordered for the patient including screening of hyperlipidemia, renal function and hepatic function. If indicated by BPH, a PSA was ordered.  Medication reconciliation,  past medical history, social history, problem list and allergies were reviewed in detail with the patient  Goals were established with regard to weight loss, exercise, and  diet in compliance with medications  End of life planning was discussed. He has an advanced directive and living will  He takes amlodipine 10 mg, HCTZ 25 mg, and 20 mg lisinopril  for hypertension . His blood pressure log shows blood pressure readings in the 130-140's consistently.   Simvastatin 20mg  for hyperlipidemia.   He gets routine eye care and dental care. He is no longer in need of colonoscopies.   He reports that he has been feeling more fatigued as of lately. He does not know if the fatigue is related to medication or inactivity.   He is in the process of moving to [place] so that he and his wife can be closer to their son. He is going to continue to see his medical professionals in [place]. He is being seem by his Dermatology for complete skin check later on this afternoon    Review of Systems  Constitutional: Negative.   HENT: Negative.   Eyes: Negative.   Respiratory: Negative.   Cardiovascular: Negative.   Gastrointestinal: Negative.   Endocrine:  Negative.   Genitourinary: Negative.   Musculoskeletal: Negative.   Skin: Negative.   Allergic/Immunologic: Negative.   Neurological: Negative.   Hematological: Negative.   Psychiatric/Behavioral: Negative.   All other systems reviewed and are negative.  Past Medical History:  Diagnosis Date  . Aneurysm ([place])   . BPH (benign prostatic hyperplasia)    upper lip   . Hearing loss    rt ear. 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 History of colonic polyps   . Hyperlipidemia   . Hypertension   . Kidney stone   . Macular degeneration 03/2015   . Melanoma ([place])   . Squamous cell skin cancer, face     Social History   Social History  . Marital status: Married    Spouse name: N/A  . Number of children: N/A  . Years of education: N/A   Occupational History  . Not on file.   Social History Main Topics  . Smoking status: Former Research scientist (life sciences)  . Smokeless tobacco: Never Used  . Alcohol use No     Comment: socially   . Drug use: No  . Sexual activity: Not on file   Other Topics Concern  . Not on file   Social History Narrative   Retired    Married    Two sons ( one in [place] and one in [place]).     Past Surgical History:  Procedure Laterality Date  . APPENDECTOMY    . CHOLECYSTECTOMY  11/2001  . LITHOTRIPSY    . Mose Surgery  Family History  Problem Relation Age of Onset  . Hyperlipidemia Mother   . Heart attack Father 40  . Hypertension Mother     No Known Allergies  Current Outpatient Prescriptions on File Prior to Visit  Medication Sig Dispense Refill  . amLODipine (NORVASC) 10 MG tablet TAKE 1 TABLET DAILY 90 tablet 0  . aspirin 81 MG tablet Take 81 mg by mouth daily.      . fish oil-omega-3 fatty acids 1000 MG capsule Take 2 g by mouth daily.      . hydrochlorothiazide (HYDRODIURIL) 25 MG tablet TAKE 1 TABLET DAILY. MAKE  AN APPOINTMENT FOR FURTHER REFILLS 90 tablet 4  . lisinopril (PRINIVIL,ZESTRIL) 40 MG tablet TAKE 1/2 TABLET (20MG ) TWO TIMES A DAY 90 tablet  3  . Multiple Vitamin (MULTIVITAMIN) tablet Take 1 tablet by mouth daily.      . simvastatin (ZOCOR) 20 MG tablet TAKE 1 TABLET AT BEDTIME 90 tablet 3   No current facility-administered medications on file prior to visit.     BP (!) 154/76 (BP Location: Left Arm)   Temp 97.9 F (36.6 C) (Oral)   Ht 5\' 11"  (1.803 m)   Wt 233 lb (105.7 kg)   BMI 32.50 kg/m       Objective:   Physical Exam  Constitutional: He is oriented to person, place, and time. He appears well-developed and well-nourished. No distress.  obese  HENT:  Head: Normocephalic and atraumatic.  Right Ear: Hearing, tympanic membrane, external ear and ear canal normal.  Left Ear: Hearing, tympanic membrane, external ear and ear canal normal.  Nose: Nose normal.  Mouth/Throat: Uvula is midline and oropharynx is clear and moist. No oropharyngeal exudate.  Eyes: Pupils are equal, round, and reactive to light. Conjunctivae and EOM are normal. Right eye exhibits no discharge. Left eye exhibits no discharge. No scleral icterus.  Neck: Normal range of motion. Neck supple. No JVD present. Carotid bruit is not present. No tracheal deviation present. No thyromegaly present.  Cardiovascular: Normal rate, regular rhythm, normal heart sounds and intact distal pulses.  Exam reveals no gallop and no friction rub.   No murmur heard. Pulmonary/Chest: Effort normal and breath sounds normal. No stridor. No respiratory distress. He has no wheezes. He has no rales. He exhibits no tenderness.  Abdominal: Soft. Bowel sounds are normal. He exhibits no distension and no mass. There is no tenderness. There is no rebound and no guarding.  Genitourinary:  Genitourinary Comments: Deferred due to age    Musculoskeletal: Normal range of motion. He exhibits no edema, tenderness or deformity.  Lymphadenopathy:    He has no cervical adenopathy.  Neurological: He is alert and oriented to person, place, and time. He has normal reflexes. He displays normal  reflexes. No cranial nerve deficit. He exhibits normal muscle tone. Coordination normal.  Skin: Skin is warm and dry. No rash noted. He is not diaphoretic. No erythema. No pallor.  Scattered AK's   Psychiatric: He has a normal mood and affect. His behavior is normal. Judgment and thought content normal.  Nursing note and vitals reviewed.     Assessment & Plan:  1. Essential hypertension - Will increase lisinopril from 20 mg to 40 mg and have him stop Norvasc for the next two weeks. He will send me his BP readings via mychart. Likely fatigue due to inactivity. Advised to stay active and work on diet to help him lose weight.  - Basic metabolic panel - CBC with Differential/Platelet - Hepatic  function panel - Lipid panel - PSA - TSH - lisinopril (PRINIVIL,ZESTRIL) 40 MG tablet; Take 1 tablet (40 mg total) by mouth daily.  Dispense: 90 tablet; Refill: 3  2. Mixed hyperlipidemia - Consider increasing statin  - Basic metabolic panel - CBC with Differential/Platelet - Hepatic function panel - Lipid panel - PSA - TSH  3. BPH associated with nocturia  - Basic metabolic panel - CBC with Differential/Platelet - Hepatic function panel - Lipid panel - PSA - TSH  Dorothyann Peng, NP

## 2016-11-02 ENCOUNTER — Encounter: Payer: Self-pay | Admitting: Adult Health

## 2016-11-05 ENCOUNTER — Encounter: Payer: Self-pay | Admitting: Adult Health

## 2016-11-06 ENCOUNTER — Other Ambulatory Visit: Payer: Self-pay | Admitting: Adult Health

## 2016-11-06 NOTE — Telephone Encounter (Signed)
Sent to the pharmacy by e-scribe for 1 year.  Pt seen on 10/30/16 for yearly.

## 2016-11-27 ENCOUNTER — Encounter: Payer: Self-pay | Admitting: Adult Health

## 2017-01-02 DIAGNOSIS — Z23 Encounter for immunization: Secondary | ICD-10-CM | POA: Diagnosis not present

## 2017-01-04 ENCOUNTER — Telehealth: Payer: Self-pay | Admitting: Adult Health

## 2017-01-04 NOTE — Telephone Encounter (Signed)
Patient Name: [facility] DOB: Jul 09, 1935 Initial Comment Caller states husband c/o chills, nausea and increased fatigue since taking flu shot on Saturday. Nurse Assessment Nurse: Rene Kocher, RN, [place] Date/Time (Eastern Time): 01/04/2017 9:14:24 AM Confirm and document reason for call. If symptomatic, describe symptoms. ---caller states her husband had the flu shot on Saturday and has had chills, aches, nausea since then. loss of appetite. pt urinated this morning. pt vomited a few times on Saturday night. Does the patient have any new or worsening symptoms? ---Yes Will a triage be completed? ---Yes Related visit to physician within the last 2 weeks? ---No Does the PT have any chronic conditions? (i.e. diabetes, asthma, etc.) ---Yes List chronic conditions. ---high blood pressure, high cholesterol Is this a behavioral health or substance abuse call? ---No Guidelines Guideline Title Affirmed Question Affirmed Notes Vomiting MILD or MODERATE vomiting (e.g., 1 - 5 times / day) Immunization Reactions Influenza (TIV; Injection) injected vaccine reactions Final Disposition User [place], RN, [place] Comments caller is unsure if her husband has a fever but pt complained yesterday of having body aches and took Tylenol then and felt better afterward. pt states that he doens't have a runny nose, sore throat or congestion. he doesn't have a cough. Caller Disagree/Comply Comply Caller Understands Yes PreDisposition Call Doctor

## 2017-01-22 DIAGNOSIS — H353222 Exudative age-related macular degeneration, left eye, with inactive choroidal neovascularization: Secondary | ICD-10-CM | POA: Diagnosis not present

## 2017-02-09 DIAGNOSIS — H6121 Impacted cerumen, right ear: Secondary | ICD-10-CM | POA: Diagnosis not present

## 2017-02-09 DIAGNOSIS — Z87891 Personal history of nicotine dependence: Secondary | ICD-10-CM | POA: Diagnosis not present

## 2017-02-09 DIAGNOSIS — H903 Sensorineural hearing loss, bilateral: Secondary | ICD-10-CM | POA: Diagnosis not present

## 2017-04-04 ENCOUNTER — Other Ambulatory Visit: Payer: Self-pay | Admitting: Family Medicine

## 2017-04-26 DIAGNOSIS — D1801 Hemangioma of skin and subcutaneous tissue: Secondary | ICD-10-CM | POA: Diagnosis not present

## 2017-04-26 DIAGNOSIS — L820 Inflamed seborrheic keratosis: Secondary | ICD-10-CM | POA: Diagnosis not present

## 2017-04-26 DIAGNOSIS — L570 Actinic keratosis: Secondary | ICD-10-CM | POA: Diagnosis not present

## 2017-04-26 DIAGNOSIS — Z85820 Personal history of malignant melanoma of skin: Secondary | ICD-10-CM | POA: Diagnosis not present

## 2017-04-30 ENCOUNTER — Other Ambulatory Visit: Payer: Self-pay | Admitting: Family Medicine

## 2017-04-30 NOTE — Telephone Encounter (Signed)
Derek Baker's pt  

## 2017-05-04 ENCOUNTER — Encounter: Payer: Self-pay | Admitting: Adult Health

## 2017-05-04 MED ORDER — SIMVASTATIN 20 MG PO TABS: 20.0000 mg | ORAL_TABLET | Freq: Every day | ORAL | 1 refills | Status: AC

## 2017-05-07 DIAGNOSIS — H353222 Exudative age-related macular degeneration, left eye, with inactive choroidal neovascularization: Secondary | ICD-10-CM | POA: Diagnosis not present

## 2017-06-18 DIAGNOSIS — H353222 Exudative age-related macular degeneration, left eye, with inactive choroidal neovascularization: Secondary | ICD-10-CM | POA: Diagnosis not present

## 2017-08-23 ENCOUNTER — Other Ambulatory Visit: Payer: Self-pay | Admitting: Adult Health

## 2017-08-24 NOTE — Telephone Encounter (Signed)
Filled for 1 year on 10/30/16.  Refill request is too early.
